# Patient Record
Sex: Female | Born: 1950 | Race: Black or African American | Hispanic: No | Marital: Married | Smoking: Never smoker
Health system: Southern US, Community
[De-identification: ages and names within clinical notes are randomized; demographics above are authoritative.]

## PROBLEM LIST (undated history)

## (undated) DIAGNOSIS — I10 Essential (primary) hypertension: Secondary | ICD-10-CM

## (undated) DIAGNOSIS — E079 Disorder of thyroid, unspecified: Secondary | ICD-10-CM

## (undated) HISTORY — DX: Disorder of thyroid, unspecified: E07.9

## (undated) HISTORY — DX: Essential (primary) hypertension: I10

## (undated) HISTORY — PX: ABDOMINAL HYSTERECTOMY: SHX81

## (undated) HISTORY — PX: EYE SURGERY: SHX253

## (undated) HISTORY — PX: BRAIN SURGERY: SHX531

## (undated) HISTORY — PX: APPENDECTOMY: SHX54

---

## 1998-07-03 ENCOUNTER — Emergency Department (HOSPITAL_COMMUNITY): Admission: EM | Admit: 1998-07-03 | Discharge: 1998-07-03 | Payer: Self-pay | Admitting: Emergency Medicine

## 1998-07-03 ENCOUNTER — Encounter: Payer: Self-pay | Admitting: Emergency Medicine

## 1999-01-13 ENCOUNTER — Encounter: Payer: Self-pay | Admitting: Emergency Medicine

## 1999-01-13 ENCOUNTER — Inpatient Hospital Stay (HOSPITAL_COMMUNITY): Admission: EM | Admit: 1999-01-13 | Discharge: 1999-01-15 | Payer: Self-pay | Admitting: Emergency Medicine

## 1999-01-15 ENCOUNTER — Encounter: Payer: Self-pay | Admitting: Emergency Medicine

## 1999-01-15 ENCOUNTER — Encounter: Payer: Self-pay | Admitting: *Deleted

## 1999-11-09 ENCOUNTER — Encounter: Payer: Self-pay | Admitting: Emergency Medicine

## 1999-11-09 ENCOUNTER — Emergency Department (HOSPITAL_COMMUNITY): Admission: EM | Admit: 1999-11-09 | Discharge: 1999-11-09 | Payer: Self-pay | Admitting: Emergency Medicine

## 2000-01-06 ENCOUNTER — Other Ambulatory Visit: Admission: RE | Admit: 2000-01-06 | Discharge: 2000-01-06 | Payer: Self-pay | Admitting: Obstetrics and Gynecology

## 2000-01-09 ENCOUNTER — Emergency Department (HOSPITAL_COMMUNITY): Admission: EM | Admit: 2000-01-09 | Discharge: 2000-01-09 | Payer: Self-pay | Admitting: Emergency Medicine

## 2000-02-28 ENCOUNTER — Ambulatory Visit (HOSPITAL_COMMUNITY): Admission: RE | Admit: 2000-02-28 | Discharge: 2000-02-28 | Payer: Self-pay | Admitting: Internal Medicine

## 2000-02-28 ENCOUNTER — Encounter: Payer: Self-pay | Admitting: Internal Medicine

## 2002-02-05 ENCOUNTER — Other Ambulatory Visit: Admission: RE | Admit: 2002-02-05 | Discharge: 2002-02-05 | Payer: Self-pay | Admitting: Obstetrics and Gynecology

## 2002-04-02 ENCOUNTER — Encounter (INDEPENDENT_AMBULATORY_CARE_PROVIDER_SITE_OTHER): Payer: Self-pay | Admitting: Specialist

## 2002-04-02 ENCOUNTER — Ambulatory Visit (HOSPITAL_COMMUNITY): Admission: RE | Admit: 2002-04-02 | Discharge: 2002-04-02 | Payer: Self-pay | Admitting: Gastroenterology

## 2003-08-10 ENCOUNTER — Emergency Department (HOSPITAL_COMMUNITY): Admission: AD | Admit: 2003-08-10 | Discharge: 2003-08-10 | Payer: Self-pay | Admitting: Family Medicine

## 2004-10-07 ENCOUNTER — Ambulatory Visit (HOSPITAL_COMMUNITY): Admission: RE | Admit: 2004-10-07 | Discharge: 2004-10-07 | Payer: Self-pay | Admitting: Internal Medicine

## 2005-06-25 ENCOUNTER — Emergency Department (HOSPITAL_COMMUNITY): Admission: EM | Admit: 2005-06-25 | Discharge: 2005-06-25 | Payer: Self-pay | Admitting: Emergency Medicine

## 2006-03-10 ENCOUNTER — Emergency Department (HOSPITAL_COMMUNITY): Admission: EM | Admit: 2006-03-10 | Discharge: 2006-03-10 | Payer: Self-pay | Admitting: *Deleted

## 2006-11-26 ENCOUNTER — Emergency Department (HOSPITAL_COMMUNITY): Admission: EM | Admit: 2006-11-26 | Discharge: 2006-11-26 | Payer: Self-pay | Admitting: Emergency Medicine

## 2007-09-30 ENCOUNTER — Encounter: Admission: RE | Admit: 2007-09-30 | Discharge: 2007-09-30 | Payer: Self-pay | Admitting: Orthopedic Surgery

## 2007-10-19 ENCOUNTER — Encounter: Admission: RE | Admit: 2007-10-19 | Discharge: 2007-10-19 | Payer: Self-pay | Admitting: Orthopedic Surgery

## 2007-11-09 ENCOUNTER — Encounter: Admission: RE | Admit: 2007-11-09 | Discharge: 2007-11-09 | Payer: Self-pay | Admitting: Orthopedic Surgery

## 2008-07-22 ENCOUNTER — Encounter: Admission: RE | Admit: 2008-07-22 | Discharge: 2008-07-22 | Payer: Self-pay | Admitting: Orthopedic Surgery

## 2010-06-13 ENCOUNTER — Encounter: Payer: Self-pay | Admitting: Chiropractic Medicine

## 2010-06-13 ENCOUNTER — Encounter: Payer: Self-pay | Admitting: Orthopedic Surgery

## 2011-03-08 LAB — COMPREHENSIVE METABOLIC PANEL
ALT: 14
BUN: 12
Calcium: 9.2
Chloride: 103
GFR calc Af Amer: >60
Sodium: 137
Total Protein: 7.2

## 2011-03-08 LAB — POCT CARDIAC MARKERS
CKMB, poc: 2.8
Operator id: 1211
Troponin i, poc: <0.05
Troponin i, poc: <0.05

## 2011-03-08 LAB — DIFFERENTIAL
Basophils Absolute: 0
Eosinophils Relative: 2

## 2011-03-08 LAB — D-DIMER, QUANTITATIVE: D-Dimer, Quant: 0.27

## 2011-03-08 LAB — CBC
Hemoglobin: 12.3
Platelets: 273
RDW: 14

## 2011-04-22 ENCOUNTER — Other Ambulatory Visit: Payer: Self-pay | Admitting: Orthopedic Surgery

## 2011-04-22 DIAGNOSIS — M542 Cervicalgia: Secondary | ICD-10-CM

## 2011-04-24 ENCOUNTER — Ambulatory Visit
Admission: RE | Admit: 2011-04-24 | Discharge: 2011-04-24 | Disposition: A | Discharge status: Home / Self Care | Payer: 59 | Source: Ambulatory Visit | Attending: Orthopedic Surgery | Admitting: Orthopedic Surgery

## 2011-04-24 DIAGNOSIS — M542 Cervicalgia: Secondary | ICD-10-CM

## 2013-04-11 ENCOUNTER — Encounter (INDEPENDENT_AMBULATORY_CARE_PROVIDER_SITE_OTHER): Payer: 59 | Admitting: Ophthalmology

## 2013-04-11 DIAGNOSIS — I10 Essential (primary) hypertension: Secondary | ICD-10-CM

## 2013-04-11 DIAGNOSIS — H35039 Hypertensive retinopathy, unspecified eye: Secondary | ICD-10-CM

## 2013-04-11 DIAGNOSIS — H5340 Unspecified visual field defects: Secondary | ICD-10-CM

## 2013-04-11 DIAGNOSIS — H43819 Vitreous degeneration, unspecified eye: Secondary | ICD-10-CM

## 2013-04-11 DIAGNOSIS — H31009 Unspecified chorioretinal scars, unspecified eye: Secondary | ICD-10-CM

## 2013-04-29 ENCOUNTER — Ambulatory Visit: Payer: Self-pay | Admitting: Ophthalmology

## 2013-05-03 ENCOUNTER — Ambulatory Visit: Payer: Self-pay | Admitting: Ophthalmology

## 2013-12-26 ENCOUNTER — Other Ambulatory Visit: Payer: Self-pay | Admitting: Internal Medicine

## 2013-12-26 DIAGNOSIS — R911 Solitary pulmonary nodule: Secondary | ICD-10-CM

## 2014-01-01 ENCOUNTER — Inpatient Hospital Stay: Admission: RE | Admit: 2014-01-01 | Payer: 59 | Source: Ambulatory Visit

## 2014-01-09 ENCOUNTER — Ambulatory Visit
Admission: RE | Admit: 2014-01-09 | Discharge: 2014-01-09 | Disposition: A | Discharge status: Home / Self Care | Payer: 59 | Source: Ambulatory Visit | Attending: Internal Medicine | Admitting: Internal Medicine

## 2014-01-09 DIAGNOSIS — R911 Solitary pulmonary nodule: Secondary | ICD-10-CM

## 2014-05-29 ENCOUNTER — Other Ambulatory Visit (HOSPITAL_COMMUNITY): Payer: Self-pay | Admitting: Respiratory Therapy

## 2014-05-29 DIAGNOSIS — J45909 Unspecified asthma, uncomplicated: Secondary | ICD-10-CM

## 2014-06-19 ENCOUNTER — Ambulatory Visit (HOSPITAL_COMMUNITY)
Admission: RE | Admit: 2014-06-19 | Discharge: 2014-06-19 | Disposition: A | Discharge status: Home / Self Care | Payer: 59 | Source: Ambulatory Visit | Attending: Internal Medicine | Admitting: Internal Medicine

## 2014-06-19 DIAGNOSIS — J45909 Unspecified asthma, uncomplicated: Secondary | ICD-10-CM | POA: Diagnosis not present

## 2014-06-19 LAB — PULMONARY FUNCTION TEST
DL/VA % pred: 120 %
DL/VA: 5.64 ml/min/mmHg/L
DLCO COR % PRED: 80 %
DLCO UNC % PRED: 80 %
DLCO cor: 18.5 ml/min/mmHg
DLCO unc: 18.5 ml/min/mmHg
FEF 25-75 Post: 2.55 L/sec
FEF 25-75 Pre: 2.57 L/sec
FEF2575-%Change-Post: 0 %
FEF2575-%PRED-POST: 141 %
FEF2575-%Pred-Pre: 142 %
FEV1-%CHANGE-POST: -1 %
FEV1-%Pred-Post: 109 %
FEV1-%Pred-Pre: 111 %
FEV1-Post: 2.05 L
FEV1-Pre: 2.09 L
FEV1FVC-%CHANGE-POST: 6 %
FEV1FVC-%PRED-PRE: 108 %
FEV6-%CHANGE-POST: -7 %
FEV6-%PRED-POST: 97 %
FEV6-%Pred-Pre: 106 %
FEV6-POST: 2.27 L
FEV6-Pre: 2.47 L
FEV6FVC-%CHANGE-POST: 0 %
FEV6FVC-%PRED-POST: 104 %
FEV6FVC-%Pred-Pre: 103 %
FVC-%Change-Post: -8 %
FVC-%PRED-PRE: 102 %
FVC-%Pred-Post: 94 %
FVC-POST: 2.27 L
FVC-Pre: 2.47 L
Post FEV1/FVC ratio: 90 %
Post FEV6/FVC ratio: 100 %
Pre FEV1/FVC ratio: 85 %
Pre FEV6/FVC Ratio: 100 %
RV % PRED: 65 %
RV: 1.33 L
TLC % PRED: 78 %
TLC: 3.83 L

## 2014-06-19 MED ORDER — ALBUTEROL SULFATE (2.5 MG/3ML) 0.083% IN NEBU
2.5000 mg | INHALATION_SOLUTION | Freq: Once | RESPIRATORY_TRACT | Status: AC
  Administered 2014-06-19: 2.5 mg via RESPIRATORY_TRACT

## 2014-11-19 ENCOUNTER — Encounter: Payer: Self-pay | Admitting: Podiatry

## 2014-11-19 ENCOUNTER — Ambulatory Visit (INDEPENDENT_AMBULATORY_CARE_PROVIDER_SITE_OTHER): Payer: 59 | Admitting: Podiatry

## 2014-11-19 VITALS — BP 112/68 | HR 57 | Ht 62.0 in | Wt 164.0 lb

## 2014-11-19 DIAGNOSIS — M79605 Pain in left leg: Secondary | ICD-10-CM

## 2014-11-19 DIAGNOSIS — M79672 Pain in left foot: Secondary | ICD-10-CM | POA: Diagnosis not present

## 2014-11-19 DIAGNOSIS — M79602 Pain in left arm: Secondary | ICD-10-CM

## 2014-11-19 DIAGNOSIS — M79604 Pain in right leg: Secondary | ICD-10-CM | POA: Insufficient documentation

## 2014-11-19 DIAGNOSIS — L600 Ingrowing nail: Secondary | ICD-10-CM | POA: Diagnosis not present

## 2014-11-19 NOTE — Patient Instructions (Signed)
Ingrown nail surgery done. Follow instruction. Return in one week.

## 2014-11-19 NOTE — Progress Notes (Signed)
Subjective: 64 year old female presents with painful ingrown left great toe. Stated that she has had previous ingrown nail surgery done over 20 years ago and is back and hurting again. She does a lot of Gym exercise and toes are painful in her shoes. Patient wants the nail to be taken care of.  Objective: Neurovascular status are within normal.  Left great toe medial border is thick and ingrown with tenderness. No drainage or cellulitis noted.  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  Plan:  Phenol and alcohol matrixectomy left great toe medial border. Left great toe was anesthetized with total 5ml mixture of 50/50 0.5% Marcaine plain and 1% Xylocaine plain. Affected medial nail border was reflected with a nail elevator and excised with nail nipper. Proximal nail matrix tissue was cauterized with Phenol soaked cotton applicator x 4 and neutralized with Alcohol soaked cotton applicator. The wound was dressed with Amerigel ointment dressing. Home care instructions and supply dispensed.  Return in 1 week for follow up.

## 2014-11-26 ENCOUNTER — Encounter: Payer: Self-pay | Admitting: Podiatry

## 2014-11-26 ENCOUNTER — Ambulatory Visit (INDEPENDENT_AMBULATORY_CARE_PROVIDER_SITE_OTHER): Payer: 59 | Admitting: Podiatry

## 2014-11-26 DIAGNOSIS — L600 Ingrowing nail: Secondary | ICD-10-CM

## 2014-11-26 NOTE — Patient Instructions (Signed)
Post nail surgery. No problem noted. Return as needed.

## 2014-11-26 NOTE — Progress Notes (Signed)
One week post op left big toe nail. No infection. Healed well. Return as needed.

## 2014-11-27 ENCOUNTER — Ambulatory Visit: Payer: 59 | Admitting: Podiatry

## 2015-04-22 ENCOUNTER — Ambulatory Visit (INDEPENDENT_AMBULATORY_CARE_PROVIDER_SITE_OTHER): Payer: Commercial Managed Care - HMO | Admitting: Family Medicine

## 2015-04-22 ENCOUNTER — Ambulatory Visit (INDEPENDENT_AMBULATORY_CARE_PROVIDER_SITE_OTHER): Payer: Commercial Managed Care - HMO

## 2015-04-22 VITALS — BP 112/66 | HR 67 | Temp 98.8°F | Resp 16 | Ht 62.0 in | Wt 167.0 lb

## 2015-04-22 DIAGNOSIS — R1030 Lower abdominal pain, unspecified: Secondary | ICD-10-CM

## 2015-04-22 LAB — POCT URINALYSIS DIP (MANUAL ENTRY)
Bilirubin, UA: NEGATIVE
Glucose, UA: NEGATIVE
Ketones, POC UA: NEGATIVE
Leukocytes, UA: NEGATIVE
Nitrite, UA: NEGATIVE
Protein Ur, POC: NEGATIVE
Spec Grav, UA: 1.025
Urobilinogen, UA: 0.2
pH, UA: 5.5

## 2015-04-22 LAB — POC MICROSCOPIC URINALYSIS (UMFC)

## 2015-04-22 LAB — POCT CBC
Granulocyte percent: 57.8 %G (ref 37–80)
HCT, POC: 38.8 % (ref 37.7–47.9)
Hemoglobin: 12.9 g/dL (ref 12.2–16.2)
Lymph, poc: 2.4 (ref 0.6–3.4)
MCH, POC: 28.8 pg (ref 27–31.2)
MCHC: 33.4 g/dL (ref 31.8–35.4)
MCV: 86.4 fL (ref 80–97)
MID (cbc): 0.3 (ref 0–0.9)
MPV: 6.9 fL (ref 0–99.8)
POC Granulocyte: 3.8 (ref 2–6.9)
POC LYMPH PERCENT: 36.9 %L (ref 10–50)
POC MID %: 5.3 %M (ref 0–12)
Platelet Count, POC: 249 10*3/uL (ref 142–424)
RBC: 4.49 M/uL (ref 4.04–5.48)
RDW, POC: 14.7 %
WBC: 6.5 10*3/uL (ref 4.6–10.2)

## 2015-04-22 MED ORDER — AMOXICILLIN-POT CLAVULANATE 875-125 MG PO TABS: 1.0000 | ORAL_TABLET | Freq: Two times a day (BID) | ORAL | Status: DC

## 2015-04-22 NOTE — Addendum Note (Signed)
Addended by: Elvina SidleLAUENSTEIN, Ichael Pullara on: 04/22/2015 12:50 PM   Modules accepted: Level of Service

## 2015-04-22 NOTE — Progress Notes (Signed)
Subjective:  This chart was scribed for  Elvina SidleKurt Lauenstein MD, by Veverly FellsHatice Demirci,scribe, at [facility].  This patient was seen in room Room/bed info not found and the patient's care was started at 12:07 PM.   Chief Complaint  Patient presents with   Abdominal Pain    Lower, inguinal area onset yesterday gradually getting worse     Patient ID: Sheryl Riley Scales, female    DOB: 1951-03-18, 64 y.o.   MRN: [id number]  HPI  HPI Comments: Sheryl Riley Dascenzo is a 64 y.o. female who presents to the [facility] complaining of gradually worsening lower abdominal pain onset yesterday.  Patient states the pain is worse when she is walking.  She has hot flashes and is unable to tell if she has been having sweats/chills.   [place] and states that she was sitting down for a long period of time during the drive there.  She is currently retired.   Patient had a large quantity of popcorn over Thanksgiving  Denies fever, difficulty with urination, vaginal discharge/bleeding.  Patient has had a hysterectomy and has had her appendix removed.   She has lost close to 40 pounds recently with diet and exercise.   Patient Active Problem List   Diagnosis Date Noted   Ingrown nail 11/19/2014   Painful legs and moving toes 11/19/2014   Past Medical History  Diagnosis Date   Hypertension    Thyroid disease    Past Surgical History  Procedure Laterality Date   Appendectomy     Brain surgery     Abdominal hysterectomy     Eye surgery     No Known Allergies Prior to Admission medications   Medication Sig Start Date End Date Taking? Authorizing Provider  atenolol (TENORMIN) 100 MG tablet Take by mouth.   Yes Historical Provider, MD  levothyroxine (SYNTHROID, LEVOTHROID) 112 MCG tablet Take by mouth. 05/13/13  Yes Historical Provider, MD  Multiple Vitamin (MULTIVITAMIN) tablet Take 1 tablet by mouth daily.   Yes Historical Provider,  MD  triamterene-hydrochlorothiazide (MAXZIDE-25) 37.5-25 MG per tablet Take by mouth.   Yes Historical Provider, MD  PARoxetine (PAXIL) 10 MG tablet Take by mouth. 06/01/13   Historical Provider, MD   Social History   Social History   Marital Status: Married    Spouse Name: N/A   Number of Children: N/A   Years of Education: N/A   Occupational History   Not on file.   Social History Main Topics   Smoking status: Never Smoker    Smokeless tobacco: Never Used   Alcohol Use: No   Drug Use: No   Sexual Activity: Not on file   Other Topics Concern   Not on file   Social History Narrative       Review of Systems  Constitutional: Negative for fever and chills.  Eyes: Negative for pain, redness and itching.  Respiratory: Negative for cough, choking and shortness of breath.   Gastrointestinal: Positive for abdominal pain. Negative for nausea and vomiting.  Genitourinary: Negative for vaginal bleeding, vaginal discharge and vaginal pain.  Musculoskeletal: Negative for neck pain and neck stiffness.       Objective:   Physical Exam  Constitutional: She is oriented to person, place, and time. She appears well-developed and well-nourished. No distress.  HENT:  Head: Normocephalic and atraumatic.  Eyes: Pupils are equal, round, and reactive to light.  Neck: Normal range of motion.  Neck supple. No thyromegaly present.  Pulmonary/Chest: Effort normal. No respiratory distress.  Abdominal: There is tenderness. There is rebound and guarding.  She has guarding and rebound in the lower quadrant.   Lymphadenopathy:    She has no cervical adenopathy.  Neurological: She is alert and oriented to person, place, and time.  Skin: Skin is warm and dry.  Psychiatric: She has a normal mood and affect. Her behavior is normal.    Filed Vitals:   04/22/15 1155  BP: 112/66  Pulse: 67  Temp: 98.8 F (37.1 C)  TempSrc: Oral  Resp: 16  Height:  (1.575 m)  Weight: 167 lb  (75.751 kg)  SpO2: 98%   [facility] (PRIMARY) x-ray report read by Dr. Milus Glazier:  Abdomen: she has a large stool burden, primarily left descending colon and rectum.   Results for orders placed or performed in visit on 04/22/15  POCT CBC  Result Value Ref Range   WBC 6.5 4.6 - 10.2 K/uL   Lymph, poc 2.4 0.6 - 3.4   POC LYMPH PERCENT 36.9 10 - 50 %L   MID (cbc) 0.3 0 - 0.9   POC MID % 5.3 0 - 12 %M   POC Granulocyte 3.8 2 - 6.9   Granulocyte percent 57.8 37 - 80 %G   RBC 4.49 4.04 - 5.48 M/uL   Hemoglobin 12.9 12.2 - 16.2 g/dL   HCT, POC 16.1 09.6 - 47.9 %   MCV 86.4 80 - 97 fL   MCH, POC 28.8 27 - 31.2 pg   MCHC 33.4 31.8 - 35.4 g/dL   RDW, POC 04.5 %   Platelet Count, POC 249 142 - 424 K/uL   MPV 6.9 0 - 99.8 fL  POCT urinalysis dipstick  Result Value Ref Range   Color, UA yellow yellow   Clarity, UA clear clear   Glucose, UA negative negative   Bilirubin, UA negative negative   Ketones, POC UA negative negative   Spec Grav, UA 1.025    Blood, UA trace-lysed (A) negative   pH, UA 5.5    Protein Ur, POC negative negative   Urobilinogen, UA 0.2    Nitrite, UA Negative Negative   Leukocytes, UA Negative Negative  POCT Microscopic Urinalysis ([facility])  Result Value Ref Range   WBC,UR,HPF,POC None None WBC/hpf   RBC,UR,HPF,POC Few (A) None RBC/hpf   Bacteria None None, Too numerous to count   Mucus Present (A) Absent   Epithelial Cells, UR Per Microscopy Few (A) None, Too numerous to count cells/hpf        Assessment & Plan:   This chart was scribed in my presence and reviewed by me personally.    ICD-9-CM ICD-10-CM   1. Abdominal pain, lower 789.09 R10.30 POCT CBC     POCT urinalysis dipstick     POCT Microscopic Urinalysis ([facility])     DG Abd 1 View     amoxicillin-clavulanate (AUGMENTIN) 875-125 MG tablet     Signed, Elvina Sidle, MD

## 2015-04-26 ENCOUNTER — Emergency Department (HOSPITAL_COMMUNITY): Payer: Commercial Managed Care - HMO

## 2015-04-26 ENCOUNTER — Encounter (HOSPITAL_COMMUNITY): Payer: Self-pay | Admitting: *Deleted

## 2015-04-26 ENCOUNTER — Ambulatory Visit (INDEPENDENT_AMBULATORY_CARE_PROVIDER_SITE_OTHER): Payer: Commercial Managed Care - HMO | Admitting: Family Medicine

## 2015-04-26 ENCOUNTER — Emergency Department (HOSPITAL_COMMUNITY)
Admission: EM | Admit: 2015-04-26 | Discharge: 2015-04-26 | Disposition: A | Discharge status: Home / Self Care | Payer: Commercial Managed Care - HMO | Attending: Emergency Medicine | Admitting: Emergency Medicine

## 2015-04-26 ENCOUNTER — Telehealth: Payer: Self-pay

## 2015-04-26 VITALS — BP 122/78 | HR 64 | Temp 97.7°F | Resp 17 | Wt 167.3 lb

## 2015-04-26 DIAGNOSIS — Z79899 Other long term (current) drug therapy: Secondary | ICD-10-CM | POA: Diagnosis not present

## 2015-04-26 DIAGNOSIS — I10 Essential (primary) hypertension: Secondary | ICD-10-CM | POA: Insufficient documentation

## 2015-04-26 DIAGNOSIS — M899 Disorder of bone, unspecified: Secondary | ICD-10-CM | POA: Diagnosis not present

## 2015-04-26 DIAGNOSIS — E079 Disorder of thyroid, unspecified: Secondary | ICD-10-CM | POA: Insufficient documentation

## 2015-04-26 DIAGNOSIS — Z792 Long term (current) use of antibiotics: Secondary | ICD-10-CM | POA: Diagnosis not present

## 2015-04-26 DIAGNOSIS — M25559 Pain in unspecified hip: Secondary | ICD-10-CM

## 2015-04-26 DIAGNOSIS — R1031 Right lower quadrant pain: Secondary | ICD-10-CM | POA: Diagnosis not present

## 2015-04-26 DIAGNOSIS — M79604 Pain in right leg: Secondary | ICD-10-CM | POA: Insufficient documentation

## 2015-04-26 LAB — URINALYSIS, ROUTINE W REFLEX MICROSCOPIC
Bilirubin Urine: NEGATIVE
Glucose, UA: NEGATIVE mg/dL
KETONES UR: NEGATIVE mg/dL
LEUKOCYTES UA: NEGATIVE
NITRITE: NEGATIVE
PH: 6 (ref 5.0–8.0)
PROTEIN: NEGATIVE mg/dL
Specific Gravity, Urine: 1.02 (ref 1.005–1.030)

## 2015-04-26 LAB — COMPREHENSIVE METABOLIC PANEL
ALT: 14 U/L (ref 14–54)
AST: 18 U/L (ref 15–41)
Albumin: 3.7 g/dL (ref 3.5–5.0)
Alkaline Phosphatase: 68 U/L (ref 38–126)
Anion gap: 7 (ref 5–15)
BILIRUBIN TOTAL: 0.6 mg/dL (ref 0.3–1.2)
BUN: 17 mg/dL (ref 6–20)
CO2: 31 mmol/L (ref 22–32)
Calcium: 10 mg/dL (ref 8.9–10.3)
Chloride: 101 mmol/L (ref 101–111)
Creatinine, Ser: 0.79 mg/dL (ref 0.44–1.00)
GFR calc Af Amer: >60 mL/min (ref >60)
Glucose, Bld: 92 mg/dL (ref 65–99)
POTASSIUM: 3.4 mmol/L — AB (ref 3.5–5.1)
Sodium: 139 mmol/L (ref 135–145)
TOTAL PROTEIN: 8.2 g/dL — AB (ref 6.5–8.1)

## 2015-04-26 LAB — CBC
HEMATOCRIT: 41.1 % (ref 36.0–46.0)
Hemoglobin: 14.1 g/dL (ref 12.0–15.0)
MCH: 30.5 pg (ref 26.0–34.0)
MCHC: 34.3 g/dL (ref 30.0–36.0)
MCV: 89 fL (ref 78.0–100.0)
PLATELETS: 283 10*3/uL (ref 150–400)
RBC: 4.62 MIL/uL (ref 3.87–5.11)
RDW: 13.8 % (ref 11.5–15.5)
WBC: 6.6 10*3/uL (ref 4.0–10.5)

## 2015-04-26 LAB — URINE MICROSCOPIC-ADD ON
Bacteria, UA: NONE SEEN
WBC UA: NONE SEEN WBC/hpf (ref 0–5)

## 2015-04-26 LAB — LIPASE, BLOOD: Lipase: 37 U/L (ref 11–51)

## 2015-04-26 MED ORDER — SODIUM CHLORIDE 0.9 % IV BOLUS (SEPSIS)
1000.0000 mL | Freq: Once | INTRAVENOUS | Status: AC
  Administered 2015-04-26: 1000 mL via INTRAVENOUS

## 2015-04-26 MED ORDER — FENTANYL CITRATE (PF) 100 MCG/2ML IJ SOLN
100.0000 ug | Freq: Once | INTRAMUSCULAR | Status: AC
  Administered 2015-04-26: 100 ug via INTRAVENOUS
  Filled 2015-04-26: qty 2

## 2015-04-26 MED ORDER — DIAZEPAM 5 MG/ML IJ SOLN
2.5000 mg | Freq: Once | INTRAMUSCULAR | Status: AC
  Administered 2015-04-26: 2.5 mg via INTRAVENOUS
  Filled 2015-04-26: qty 2

## 2015-04-26 MED ORDER — BARIUM SULFATE 2.1 % PO SUSP
ORAL | Status: AC
  Administered 2015-04-26: 450 mL via ORAL
  Filled 2015-04-26: qty 1

## 2015-04-26 MED ORDER — ONDANSETRON HCL 4 MG/2ML IJ SOLN
4.0000 mg | Freq: Once | INTRAMUSCULAR | Status: AC
  Administered 2015-04-26: 4 mg via INTRAVENOUS
  Filled 2015-04-26: qty 2

## 2015-04-26 MED ORDER — DIAZEPAM 5 MG PO TABS: 5.0000 mg | ORAL_TABLET | Freq: Three times a day (TID) | ORAL | Status: DC | PRN

## 2015-04-26 MED ORDER — IOHEXOL 300 MG/ML  SOLN
100.0000 mL | Freq: Once | INTRAMUSCULAR | Status: AC | PRN
  Administered 2015-04-26: 100 mL via INTRAVENOUS

## 2015-04-26 MED ORDER — IBUPROFEN 400 MG PO TABS: 400.0000 mg | ORAL_TABLET | Freq: Three times a day (TID) | ORAL | Status: AC

## 2015-04-26 NOTE — ED Provider Notes (Signed)
CSN: [id number]     Arrival date & time 04/26/15  1516 History   First MD Initiated Contact with Patient 04/26/15 [phone]     Chief Complaint  Patient presents with  . Pelvic Pain     (Consider location/radiation/quality/duration/timing/severity/associated sxs/prior Treatment) HPI  64 year old female with a history of hypertension and thyroid disease presents with approximately 5 days of Progressively worsening suprapubic pain. No nausea, vomiting, diarrhea, constipation, fever or other associated symptoms. Describes it as feeling like a broken bone. Made worse by moving her right leg, but not her left leg. Has progressively worsen during that time. She is having difficulty walking because of pain. She had a long car ride to 4 days and back the day before started. No asymmetric swelling in her lower extremities.   Past Medical History  Diagnosis Date  . Hypertension   . Thyroid disease    Past Surgical History  Procedure Laterality Date  . Appendectomy    . Brain surgery    . Abdominal hysterectomy    . Eye surgery     Family History  Problem Relation Age of Onset  . Alcohol abuse Brother   . Hypertension Brother   . Glaucoma Brother   . Hypertension Brother   . Glaucoma Brother   . Hypertension Brother    Social History  Substance Use Topics  . Smoking status: Never Smoker   . Smokeless tobacco: Never Used  . Alcohol Use: No   OB History    No data available     Review of Systems  Constitutional: Negative for fever and chills.  HENT: Negative for sneezing.   Eyes: Negative for photophobia and pain.  Respiratory: Negative for cough and shortness of breath.   Cardiovascular: Negative for chest pain.  Gastrointestinal: Positive for abdominal pain. Negative for nausea, vomiting, diarrhea and constipation.  Endocrine: Negative for polydipsia and polyuria.  Genitourinary: Negative for urgency and dyspareunia.  Musculoskeletal: Negative for back pain.  Skin: Negative for  rash and wound.  Neurological: Negative for seizures and numbness.  All other systems reviewed and are negative.     Allergies  Review of patient's allergies indicates no known allergies.  Home Medications   Prior to Admission medications   Medication Sig Start Date End Date Taking? Authorizing Provider  acetaminophen (TYLENOL) 325 MG tablet Take 650 mg by mouth every 6 (six) hours as needed for mild pain.   Yes Historical Provider, MD  atenolol (TENORMIN) 50 MG tablet Take 50 mg by mouth daily.   Yes Historical Provider, MD  Cyanocobalamin (VITAMIN B-12 PO) Take 1 tablet by mouth daily.   Yes Historical Provider, MD  levothyroxine (SYNTHROID, LEVOTHROID) 100 MCG tablet Take 100 mcg by mouth daily before breakfast.   Yes Historical Provider, MD  Multiple Vitamin (MULTIVITAMIN) tablet Take 1 tablet by mouth daily.   Yes Historical Provider, MD  niacin 500 MG tablet Take 500 mg by mouth at bedtime.   Yes Historical Provider, MD  omega-3 acid ethyl esters (LOVAZA) 1 G capsule Take 1 g by mouth 2 (two) times daily.    Yes Historical Provider, MD  triamterene-hydrochlorothiazide (MAXZIDE-25) 37.5-25 MG per tablet Take 1 tablet by mouth daily.    Yes Historical Provider, MD  amoxicillin-clavulanate (AUGMENTIN) 875-125 MG tablet Take 1 tablet by mouth 2 (two) times daily. Patient not taking: Reported on 04/26/2015 04/22/15   Elvina SidleKurt Lauenstein, MD  diazepam (VALIUM) 5 MG tablet Take 1 tablet (5 mg total) by mouth every 8 (eight) hours  as needed for muscle spasms. 04/26/15   Marily Memos, MD  ibuprofen (ADVIL,MOTRIN) 400 MG tablet Take 1 tablet (400 mg total) by mouth 3 (three) times daily. 04/26/15 05/03/15  Barbara Cower Chao Blazejewski, MD   BP 127/65 mmHg  Pulse 61  Temp(Src) 97.7 F (36.5 C) (Oral)  Resp 11  SpO2 99% Physical Exam  Constitutional: She is oriented to person, place, and time. She appears well-developed and well-nourished.  HENT:  Head: Normocephalic and atraumatic.  Neck: Normal range of  motion.  Cardiovascular: Normal rate and regular rhythm.   Pulmonary/Chest: Effort normal and breath sounds normal. No stridor. No respiratory distress.  Abdominal: Soft. She exhibits no distension. There is tenderness (suprapubic and less in RLQ). There is no rebound and no guarding.  Musculoskeletal: Normal range of motion. She exhibits no edema or tenderness.  Suprapubic abdominal pain worse with raising right leg  Neurological: She is alert and oriented to person, place, and time.  Skin: Skin is warm and dry.  Nursing note and vitals reviewed.   ED Course  Procedures (including critical care time) Labs Review Labs Reviewed  COMPREHENSIVE METABOLIC PANEL - Abnormal; Notable for the following:    Potassium 3.4 (*)    Total Protein 8.2 (*)    All other components within normal limits  URINALYSIS, ROUTINE W REFLEX MICROSCOPIC (NOT AT [facility]) - Abnormal; Notable for the following:    Hgb urine dipstick SMALL (*)    All other components within normal limits  URINE MICROSCOPIC-ADD ON - Abnormal; Notable for the following:    Squamous Epithelial / LPF 0-5 (*)    All other components within normal limits  LIPASE, BLOOD  CBC    Imaging Review Ct Abdomen Pelvis W Contrast  04/26/2015  CLINICAL DATA:  Lower abdomen and pelvic pain. Clinical concern for diverticulitis or abscess. EXAM: CT ABDOMEN AND PELVIS WITH CONTRAST TECHNIQUE: Multidetector CT imaging of the abdomen and pelvis was performed using the standard protocol following bolus administration of intravenous contrast. CONTRAST:  OMNIPAQUE IOHEXOL 300 MG/ML  SOLN COMPARISON:  Chest CT dated 01/09/2014. FINDINGS: Lower chest:  Clear lung bases. Hepatobiliary: Normal appearing liver and gallbladder. Pancreas: Mildly dilated pancreatic duct, measuring 3.6 mm in maximum diameter. No obstructing stone or mass seen. Spleen: Within normal limits in size and appearance. Adrenals/Urinary Tract: No masses identified. No evidence of  hydronephrosis. Stomach/Bowel: Normal appearing colon, small bowel and stomach. No evidence of appendicitis or diverticulitis. Vascular/Lymphatic: Minimal atheromatous arterial calcifications. No enlarged lymph nodes. Reproductive: No mass or other significant abnormality. Other: Small amount of free peritoneal fluid in the inferior pelvis on the right. Musculoskeletal: Lower thoracic spine degenerative changes. Disc bulges in the lower lumbar spine. IMPRESSION: 1. Small amount of free peritoneal fluid in the inferior pelvis on the right. 2. Mildly dilated pancreatic duct with no visible obstructing stone or mass. This is of doubtful clinical significance. 3. No evidence of diverticulitis and no abscess. Electronically Signed   By: Beckie Salts M.D.   On: 04/26/2015 19:35   I have personally reviewed and evaluated these images and lab results as part of my medical decision-making.   EKG Interpretation None      MDM   Final diagnoses:  Pain of right lower extremity   64 yo w/ concerning for diverticulitis, UTI, psoas abscess, dvt, vs msk. Will check labs, tx symptomatically and CT.  Symptoms improved markedly with valium. Able to ambulate better with decreased pain. CT negative for acute findings. i think this is  MSK in nature. Will tx appropriately. When i brought this up she stated she had started using the 'thigh machine' at the gym more recently doing as much as 200 reps continuously. I asked her to stop, at least until her symptoms improved.    Marily Memos, MD 04/26/15 2300

## 2015-04-26 NOTE — ED Notes (Signed)
Pt sent here from [facility] due to lower abd/pelvic pain. Reports being treated with antibiotics for possible diverticulitis. Denies n/v/d or urinary symptoms.

## 2015-04-26 NOTE — ED Notes (Signed)
Patient transported to CT 

## 2015-04-26 NOTE — Discharge Instructions (Signed)

## 2015-04-26 NOTE — Telephone Encounter (Signed)
Patient would like Dr. Elbert EwingsL to know she is not any better.  Wants a call back  [phone]

## 2015-04-26 NOTE — Progress Notes (Signed)
 @  By signing my name below, I, Raven Small, attest that this documentation has been prepared under the direction and in the presence of Elvina Sidle, MD.  Electronically Signed: Andrew Au, ED Scribe. 04/26/2015. 2:57 PM.   Patient ID: TAMRE CASS MRN: [id number], DOB: [birth year], 64 y.o. Date of Encounter: 04/26/2015, 2:57 PM  Primary Physician: Minda Meo, MD  Chief Complaint:  Chief Complaint  Patient presents with  . Follow-up    abdominal pain, lower side    HPI: 64 y.o. year old female with history below presents with low abdominal pain. She was seen here 4 days ago for the same complaint which was suspected to be diverticulitis and was treated with Augmentin. She returns today for persistent pain to pubis synthesis. She continues to deny falls or injury but does mentions she exercises regularly. She reports trouble ambulating secondary to pain and has started walking with a cane. She has difficulty moving her right leg and reports exacerbation to abdomina; pain with trying to do this. She also had worsening pain when lying on her left side and has trouble sleeping during the night because of this. She reports regular BM's.    Past Medical History  Diagnosis Date  . Hypertension   . Thyroid disease      Home Meds: Prior to Admission medications   Medication Sig Start Date End Date Taking? Authorizing Provider  amoxicillin-clavulanate (AUGMENTIN) 875-125 MG tablet Take 1 tablet by mouth 2 (two) times daily. 04/22/15  Yes Elvina Sidle, MD  atenolol (TENORMIN) 100 MG tablet Take by mouth.   Yes Historical Provider, MD  levothyroxine (SYNTHROID, LEVOTHROID) 112 MCG tablet Take by mouth. 05/13/13  Yes Historical Provider, MD  Multiple Vitamin (MULTIVITAMIN) tablet Take 1 tablet by mouth daily.   Yes Historical Provider, MD  omega-3 acid ethyl esters (LOVAZA) 1 G capsule Take by mouth 2 (two) times daily.   Yes Historical Provider, MD  PARoxetine (PAXIL)  10 MG tablet Take by mouth. 06/01/13  Yes Historical Provider, MD  triamterene-hydrochlorothiazide (MAXZIDE-25) 37.5-25 MG per tablet Take by mouth.   Yes Historical Provider, MD    Allergies: No Known Allergies  Social History   Social History  . Marital Status: Married    Spouse Name: N/A  . Number of Children: N/A  . Years of Education: N/A   Occupational History  . Not on file.   Social History Main Topics  . Smoking status: Never Smoker   . Smokeless tobacco: Never Used  . Alcohol Use: No  . Drug Use: No  . Sexual Activity: Not on file   Other Topics Concern  . Not on file   Social History Narrative    Review of Systems: Constitutional: negative for chills, fever, night sweats, weight changes, or fatigue  HEENT: negative for vision changes, hearing loss, congestion, rhinorrhea, ST, epistaxis, or sinus pressure Cardiovascular: negative for chest pain or palpitations Respiratory: negative for hemoptysis, wheezing, shortness of breath, or cough Abdominal: negative for nausea, vomiting, diarrhea, or constipation Dermatological: negative for rash Neurologic: negative for headache, dizziness, or syncope All other systems reviewed and are otherwise negative with the exception to those above and in the HPI.   Physical Exam: Blood pressure 122/78, pulse 64, temperature 97.7 F (36.5 C), temperature source Oral, resp. rate 17, weight 167 lb 4.8 oz (75.887 kg), SpO2 99 %., Body mass index is 30.59 kg/(m^2). General: Well developed, well nourished, in no acute distress. Head: Normocephalic, atraumatic, eyes without discharge, sclera non-icteric, nares  are without discharge. Bilateral auditory canals clear, TM's are without perforation, pearly grey and translucent with reflective cone of light bilaterally. Oral cavity moist, posterior pharynx without exudate, erythema, peritonsillar abscess, or post nasal drip.  Neck: Supple. No thyromegaly. Full ROM. No lymphadenopathy. Lungs:  Clear bilaterally to auscultation without wheezes, rales, or rhonchi. Breathing is unlabored. Heart: RRR with S1 S2. No murmurs, rubs, or gallops appreciated. Abdomen: Soft, non-tender, non-distended with normoactive bowel sounds. No hepatomegaly. No rebound/guarding. No obvious abdominal masses. Msk:  Strength and tone normal for age. Extremities/Skin: Warm and dry. No clubbing or cyanosis. No edema. No rashes or suspicious lesions. Neuro: Alert and oriented X 3. Moves all extremities spontaneously. Gait is normal. CNII-XII grossly in tact. Psych:  Responds to questions appropriately with a normal affect.    ASSESSMENT AND PLAN:  64 y.o. year old female with worsening pubic ramus pain This chart was scribed in my presence and reviewed by me personally.    ICD-9-CM ICD-10-CM   1. Pubic bone pain 733.90 M89.9    I originally thought this patient had diverticulitis. At this point her pain is exquisitely located over the symphysis pubis and I believe she needs further imaging to determine what is causing this. In view of the fact that she's had cancer in the past, either CAT scan or MRI would be preferred given the normal plain film she had several days ago.  I direct the patient go to the emergency room and she accepts my advice and will go directly to [facility]   Signed, Elvina SidleKurt Christain Niznik, MD 04/26/2015 2:57 PM

## 2015-04-27 NOTE — Telephone Encounter (Signed)
Pt went to ED

## 2015-11-25 ENCOUNTER — Other Ambulatory Visit: Payer: Self-pay | Admitting: Orthopaedic Surgery

## 2015-11-25 DIAGNOSIS — M25512 Pain in left shoulder: Secondary | ICD-10-CM

## 2015-12-01 ENCOUNTER — Ambulatory Visit
Admission: RE | Admit: 2015-12-01 | Discharge: 2015-12-01 | Disposition: A | Discharge status: Home / Self Care | Payer: Commercial Managed Care - HMO | Source: Ambulatory Visit | Attending: Orthopaedic Surgery | Admitting: Orthopaedic Surgery

## 2015-12-01 DIAGNOSIS — M25512 Pain in left shoulder: Secondary | ICD-10-CM

## 2015-12-02 ENCOUNTER — Other Ambulatory Visit: Payer: 59

## 2016-03-22 ENCOUNTER — Encounter (INDEPENDENT_AMBULATORY_CARE_PROVIDER_SITE_OTHER): Payer: Self-pay | Admitting: Orthopaedic Surgery

## 2016-03-22 ENCOUNTER — Ambulatory Visit (INDEPENDENT_AMBULATORY_CARE_PROVIDER_SITE_OTHER): Payer: Commercial Managed Care - HMO | Admitting: Orthopaedic Surgery

## 2016-03-22 DIAGNOSIS — M7542 Impingement syndrome of left shoulder: Secondary | ICD-10-CM | POA: Insufficient documentation

## 2016-03-22 NOTE — Progress Notes (Signed)
   Office Visit Note   Patient: Sheryl Riley           Date of Birth: 01/06/1951           MRN: [id number] Visit Date: 03/22/2016              Requested by: Geoffry Paradiseichard Aronson, MD [address] [place], [place] [id number] PCP: Minda MeoARONSON,RICHARD A, MD   Assessment & Plan: Visit Diagnoses:  1. Impingement syndrome of left shoulder     Plan:  - patient is improving well - continue HEP - essentially at MMI - f/u prn  Follow-Up Instructions: Return if symptoms worsen or fail to improve.   Orders:  No orders of the defined types were placed in this encounter.  No orders of the defined types were placed in this encounter.     Procedures: No procedures performed   Clinical Data: No additional findings.   Subjective: Chief Complaint  Patient presents with  . Left Shoulder - Routine Post Op, Follow-up    3 month f/u for left shoulder scope, DCE, SAD.  Doing well.  Not taking pain meds.  Only has mild pain when reaching way up above head.  Satisfied with surgery.    Review of Systems   Objective: Vital Signs: There were no vitals taken for this visit.  Physical Exam  Left Shoulder Exam   Tenderness  The patient is experiencing no tenderness.     Muscle Strength  The patient has normal left shoulder strength.  Other  Sensation: normal Pulse: present       Specialty Comments:  No specialty comments available.  Imaging: No results found.   PMFS History: Patient Active Problem List   Diagnosis Date Noted  . Impingement syndrome of left shoulder 03/22/2016  . Ingrown nail 11/19/2014  . Painful legs and moving toes 11/19/2014   Past Medical History:  Diagnosis Date  . Hypertension   . Thyroid disease     Family History  Problem Relation Age of Onset  . Alcohol abuse Brother   . Hypertension Brother   . Glaucoma Brother   . Hypertension Brother   . Glaucoma Brother   . Hypertension Brother     Past Surgical History:  Procedure  Laterality Date  . ABDOMINAL HYSTERECTOMY    . APPENDECTOMY    . BRAIN SURGERY    . EYE SURGERY     Social History   Occupational History  . Not on file.   Social History Main Topics  . Smoking status: Never Smoker  . Smokeless tobacco: Never Used  . Alcohol use No  . Drug use: No  . Sexual activity: Not on file

## 2016-04-08 ENCOUNTER — Other Ambulatory Visit: Payer: Self-pay | Admitting: Internal Medicine

## 2016-04-08 ENCOUNTER — Ambulatory Visit
Admission: RE | Admit: 2016-04-08 | Discharge: 2016-04-08 | Disposition: A | Discharge status: Home / Self Care | Payer: Commercial Managed Care - HMO | Source: Ambulatory Visit | Attending: Internal Medicine | Admitting: Internal Medicine

## 2016-04-08 DIAGNOSIS — J45909 Unspecified asthma, uncomplicated: Secondary | ICD-10-CM

## 2016-04-13 ENCOUNTER — Other Ambulatory Visit: Payer: Self-pay | Admitting: Internal Medicine

## 2016-04-13 DIAGNOSIS — R918 Other nonspecific abnormal finding of lung field: Secondary | ICD-10-CM

## 2016-04-20 ENCOUNTER — Ambulatory Visit
Admission: RE | Admit: 2016-04-20 | Discharge: 2016-04-20 | Disposition: A | Discharge status: Home / Self Care | Payer: Commercial Managed Care - HMO | Source: Ambulatory Visit | Attending: Internal Medicine | Admitting: Internal Medicine

## 2016-04-20 DIAGNOSIS — R918 Other nonspecific abnormal finding of lung field: Secondary | ICD-10-CM

## 2016-12-30 ENCOUNTER — Emergency Department (HOSPITAL_COMMUNITY): Payer: Commercial Managed Care - HMO

## 2016-12-30 ENCOUNTER — Encounter (HOSPITAL_COMMUNITY): Payer: Self-pay | Admitting: Emergency Medicine

## 2016-12-30 DIAGNOSIS — K219 Gastro-esophageal reflux disease without esophagitis: Secondary | ICD-10-CM | POA: Insufficient documentation

## 2016-12-30 DIAGNOSIS — Z79899 Other long term (current) drug therapy: Secondary | ICD-10-CM | POA: Insufficient documentation

## 2016-12-30 DIAGNOSIS — R071 Chest pain on breathing: Secondary | ICD-10-CM | POA: Diagnosis present

## 2016-12-30 DIAGNOSIS — I10 Essential (primary) hypertension: Secondary | ICD-10-CM | POA: Diagnosis not present

## 2016-12-30 LAB — CBC
HEMATOCRIT: 37.7 % (ref 36.0–46.0)
HEMOGLOBIN: 12.7 g/dL (ref 12.0–15.0)
MCH: 29.1 pg (ref 26.0–34.0)
MCHC: 33.7 g/dL (ref 30.0–36.0)
MCV: 86.5 fL (ref 78.0–100.0)
Platelets: 275 10*3/uL (ref 150–400)
RBC: 4.36 MIL/uL (ref 3.87–5.11)
RDW: 14.1 % (ref 11.5–15.5)
WBC: 6.4 10*3/uL (ref 4.0–10.5)

## 2016-12-30 LAB — BASIC METABOLIC PANEL
ANION GAP: 8 (ref 5–15)
BUN: 18 mg/dL (ref 6–20)
CALCIUM: 9.4 mg/dL (ref 8.9–10.3)
CO2: 28 mmol/L (ref 22–32)
Chloride: 103 mmol/L (ref 101–111)
Creatinine, Ser: 0.96 mg/dL (ref 0.44–1.00)
GFR calc non Af Amer: 60 mL/min — ABNORMAL LOW (ref >60)
GLUCOSE: 108 mg/dL — AB (ref 65–99)
Potassium: 3.5 mmol/L (ref 3.5–5.1)
Sodium: 139 mmol/L (ref 135–145)

## 2016-12-30 LAB — I-STAT TROPONIN, ED: Troponin i, poc: 0 ng/mL (ref 0.00–0.08)

## 2016-12-30 NOTE — ED Triage Notes (Signed)
Patient reports central chest tightness with sob and dry cough onset this evening , denies emesis or diaphoresis , pain increases with deep inspiration .

## 2016-12-31 ENCOUNTER — Emergency Department (HOSPITAL_COMMUNITY)
Admission: EM | Admit: 2016-12-31 | Discharge: 2016-12-31 | Disposition: A | Discharge status: Home / Self Care | Payer: Commercial Managed Care - HMO | Attending: Emergency Medicine | Admitting: Emergency Medicine

## 2016-12-31 ENCOUNTER — Encounter (HOSPITAL_COMMUNITY): Payer: Self-pay | Admitting: Emergency Medicine

## 2016-12-31 ENCOUNTER — Other Ambulatory Visit: Payer: Self-pay

## 2016-12-31 DIAGNOSIS — K219 Gastro-esophageal reflux disease without esophagitis: Secondary | ICD-10-CM

## 2016-12-31 LAB — I-STAT TROPONIN, ED: Troponin i, poc: 0.01 ng/mL (ref 0.00–0.08)

## 2016-12-31 MED ORDER — GI COCKTAIL ~~LOC~~
30.0000 mL | Freq: Once | ORAL | Status: AC
  Administered 2016-12-31: 30 mL via ORAL
  Filled 2016-12-31: qty 30

## 2016-12-31 MED ORDER — SUCRALFATE 1 GM/10ML PO SUSP: 1.0000 g | Freq: Three times a day (TID) | ORAL | 0 refills | Status: AC

## 2016-12-31 NOTE — ED Provider Notes (Signed)
[facility]-EMERGENCY DEPT Provider Note   CSN: [id number] Arrival date & time: 12/30/16  2232   By signing my name below, I, Clarisse GougeXavier Herndon, attest that this documentation has been prepared under the direction and in the presence of Aleese Kamps, MD. Electronically signed, Clarisse GougeXavier Herndon, ED Scribe. 12/31/16. 2:57 AM.   History   Chief Complaint Chief Complaint  Patient presents with  . Chest Pain   The history is provided by the patient and medical records. No language interpreter was used.  Chest Pain   This is a new problem. The current episode started yesterday. The problem occurs constantly. The problem has been gradually worsening. The pain is associated with breathing and eating. The pain is present in the substernal region. The pain is moderate. The quality of the pain is described as burning and pressure-like. The pain does not radiate. The symptoms are aggravated by deep breathing. Pertinent negatives include no abdominal pain, no back pain, no exertional chest pressure, no lower extremity edema, no palpitations, no shortness of breath, no vomiting and no weakness. She has tried nothing for the symptoms. There are no known risk factors.  Pertinent negatives for past medical history include no aneurysm.  Pertinent negatives for family medical history include: no aortic dissection.  Procedure history is negative for cardiac catheterization.    Sheryl Riley is a 66 y.o. female with h/o HTN and thyroid disease presenting to the Emergency Department concerning central chest pain onset at church last night. Dry cough associated. She describes constant pressure and burning moderately that is worse with deep breathing. Pt alleges she ate [employer] chicken and fries, popcorn adn orange juice yesterday. She states she has had similar pain with pneumonia and bronchitis in the past. No wheezing, congestion or SOB.  Past Medical History:  Diagnosis Date  . Hypertension   . Thyroid disease      Patient Active Problem List   Diagnosis Date Noted  . Impingement syndrome of left shoulder 03/22/2016  . Ingrown nail 11/19/2014  . Painful legs and moving toes 11/19/2014    Past Surgical History:  Procedure Laterality Date  . ABDOMINAL HYSTERECTOMY    . APPENDECTOMY    . BRAIN SURGERY    . EYE SURGERY      OB History    No data available       Home Medications    Prior to Admission medications   Medication Sig Start Date End Date Taking? Authorizing Provider  acetaminophen (TYLENOL) 325 MG tablet Take 650 mg by mouth every 6 (six) hours as needed for mild pain.    [provider]  amoxicillin-clavulanate (AUGMENTIN) 875-125 MG tablet Take 1 tablet by mouth 2 (two) times daily. 04/22/15   Elvina SidleLauenstein, Kurt, MD  atenolol (TENORMIN) 50 MG tablet Take 50 mg by mouth daily.    [provider]  Cyanocobalamin (VITAMIN B-12 PO) Take 1 tablet by mouth daily.    [provider]  diazepam (VALIUM) 5 MG tablet Take 1 tablet (5 mg total) by mouth every 8 (eight) hours as needed for muscle spasms. 04/26/15   Mesner, Barbara CowerJason, MD  levothyroxine (SYNTHROID, LEVOTHROID) 100 MCG tablet Take 100 mcg by mouth daily before breakfast.    [provider]  Multiple Vitamin (MULTIVITAMIN) tablet Take 1 tablet by mouth daily.    [provider]  niacin 500 MG tablet Take 500 mg by mouth at bedtime.    [provider]  omega-3 acid ethyl esters (LOVAZA) 1 G capsule Take  1 g by mouth 2 (two) times daily.     [provider]  triamterene-hydrochlorothiazide (MAXZIDE-25) 37.5-25 MG per tablet Take 1 tablet by mouth daily.     [provider]    Family History Family History  Problem Relation Age of Onset  . Alcohol abuse Brother   . Hypertension Brother   . Glaucoma Brother   . Hypertension Brother   . Glaucoma Brother   . Hypertension Brother     Social History Social History  Substance Use Topics  . Smoking status:  Never Smoker  . Smokeless tobacco: Never Used  . Alcohol use No     Allergies   Patient has no known allergies.   Review of Systems Review of Systems  Respiratory: Negative for chest tightness and shortness of breath.   Cardiovascular: Positive for chest pain. Negative for palpitations and leg swelling.  Gastrointestinal: Negative for abdominal pain and vomiting.  Musculoskeletal: Negative for back pain.  Neurological: Negative for weakness.  All other systems reviewed and are negative.    Physical Exam Updated Vital Signs BP 131/87 (BP Location: Left Arm)   Pulse 68   Temp 97.6 F (36.4 C) (Oral)   Resp 16   Ht 5\' 1"  (1.549 m)   Wt 178 lb (80.7 kg)   SpO2 100%   BMI 33.63 kg/m   Physical Exam  Constitutional: She is oriented to person, place, and time. She appears well-developed and well-nourished. No distress.  HENT:  Head: Normocephalic and atraumatic.  Mouth/Throat: Oropharynx is clear and moist and mucous membranes are normal. No oropharyngeal exudate.  Eyes: Pupils are equal, round, and reactive to light. Conjunctivae are normal.  Neck: Normal range of motion. Neck supple. No JVD present. Carotid bruit is not present.  No lymph adenopathy  Cardiovascular: Normal rate, regular rhythm, normal heart sounds and intact distal pulses.   Pulmonary/Chest: Effort normal and breath sounds normal. No stridor.  Abdominal: Soft. She exhibits no fluid wave and no mass. Bowel sounds are increased. There is no rebound and no guarding.  Bowel sounds into thoracic cavity with whooshes of fluid. Some constipation. NL bowel sounds.  Musculoskeletal: Normal range of motion. She exhibits no edema.  Intact distal pulses. No edema. All compartments soft  Lymphadenopathy:    She has no cervical adenopathy.  Neurological: She is alert and oriented to person, place, and time. She displays normal reflexes.  Skin: Skin is warm and dry. Capillary refill takes less than 2 seconds.   Psychiatric: She has a normal mood and affect.  Nursing note and vitals reviewed.    ED Treatments / Results   Vitals:   12/31/16 0245 12/31/16 0300  BP: 128/75 112/68  Pulse: (!) 54 (!) 57  Resp: 14 16  Temp:    SpO2: 100% 97%   DIAGNOSTIC STUDIES: Oxygen Saturation is 100% on RA, NL by my interpretation.    COORDINATION OF CARE: 2:51 AM-Discussed next steps with pt. Pt verbalized understanding and is agreeable with the plan. Will order labs, blood work and medications.   Labs (all labs ordered are listed, but only abnormal results are displayed)  Results for orders placed or performed during the hospital encounter of 12/31/16  Basic metabolic panel  Result Value Ref Range   Sodium 139 135 - 145 mmol/L   Potassium 3.5 3.5 - 5.1 mmol/L   Chloride 103 101 - 111 mmol/L   CO2 28 22 - 32 mmol/L   Glucose, Bld 108 (H) 65 - 99  mg/dL   BUN 18 6 - 20 mg/dL   Creatinine, Ser 1.61 0.44 - 1.00 mg/dL   Calcium 9.4 8.9 - 09.6 mg/dL   GFR calc non Af Amer 60 (L) >60 mL/min   GFR calc Af Amer >60 >60 mL/min   Anion gap 8 5 - 15  CBC  Result Value Ref Range   WBC 6.4 4.0 - 10.5 K/uL   RBC 4.36 3.87 - 5.11 MIL/uL   Hemoglobin 12.7 12.0 - 15.0 g/dL   HCT 04.5 40.9 - 81.1 %   MCV 86.5 78.0 - 100.0 fL   MCH 29.1 26.0 - 34.0 pg   MCHC 33.7 30.0 - 36.0 g/dL   RDW 91.4 78.2 - 95.6 %   Platelets 275 150 - 400 K/uL  I-stat troponin, ED  Result Value Ref Range   Troponin i, poc 0.00 0.00 - 0.08 ng/mL   Comment 3          I-stat troponin, ED  Result Value Ref Range   Troponin i, poc 0.01 0.00 - 0.08 ng/mL   Comment 3           Dg Chest 2 View  Result Date: 12/30/2016 CLINICAL DATA:  Lambert Mody, central chest pain, shortness of breath and cough for 1 hour. EXAM: CHEST  2 VIEW COMPARISON:  04/08/2016 and chest CT dated 04/20/2016. FINDINGS: Normal sized heart. Clear lungs. Mild diffuse accentuation of the interstitial markings. Normal vascularity. Mild thoracic spine degenerative changes.  IMPRESSION: No acute abnormality.  Mild chronic interstitial lung disease. Electronically Signed   By: Beckie Salts M.D.   On: 12/30/2016 23:24  ] EKG   EKG Interpretation  Date/Time:  Saturday December 31 2016 04:20:25 EDT Ventricular Rate:  60 PR Interval:    QRS Duration: 108 QT Interval:  472 QTC Calculation: 472 R Axis:   24 Text Interpretation:  Sinus rhythm Consider left atrial enlargement Confirmed by Nannette Zill ([id number]) on 12/31/2016 4:23:59 AM       Radiology Dg Chest 2 View  Result Date: 12/30/2016 CLINICAL DATA:  Lambert Mody, central chest pain, shortness of breath and cough for 1 hour. EXAM: CHEST  2 VIEW COMPARISON:  04/08/2016 and chest CT dated 04/20/2016. FINDINGS: Normal sized heart. Clear lungs. Mild diffuse accentuation of the interstitial markings. Normal vascularity. Mild thoracic spine degenerative changes. IMPRESSION: No acute abnormality.  Mild chronic interstitial lung disease. Electronically Signed   By: Beckie Salts M.D.   On: 12/30/2016 23:24    Procedures Procedures (including critical care time)  Medications Ordered in ED Medications  gi cocktail (Maalox,Lidocaine,Donnatal) (30 mLs Oral Given 12/31/16 0334)      Final Clinical Impressions(s) / ED Diagnoses  Symptoms consistent with GERD:  The patient is very well appearing and has been observed in the ED.  Strict return precautions given for: vomiting, weakness,  intractable abdominal pain, hard or rigid abdomen, inability to pass gas or stool, intractable diarrhea, inability to tolerate oral liquids or foods, shortness of breath, changes in vision or thinking, chest pain, dyspnea on exertion, weakness or numbness or any concerns. No signs of systemic illness or infection. The patient is nontoxic-appearing on exam and vital signs are within normal limits.   I have reviewed the triage vital signs and the nursing notes. Pertinent labs &imaging results that were available during my care of the patient were  reviewed by me and considered in my medical decision making (see chart for details).  After history, exam, and medical workup I feel the  patient has been appropriately medically screened and is safe for discharge home. Pertinent diagnoses were discussed with the patient. Patient was given return precautions.  I personally performed the services described in this documentation, which was scribed in my presence. The recorded information has been reviewed and is accurate.       Manette Doto, MD 12/31/16 [phone]

## 2017-07-12 ENCOUNTER — Other Ambulatory Visit: Payer: Self-pay | Admitting: Orthopaedic Surgery

## 2017-07-12 DIAGNOSIS — M5136 Other intervertebral disc degeneration, lumbar region: Secondary | ICD-10-CM

## 2017-12-10 ENCOUNTER — Ambulatory Visit
Admission: RE | Admit: 2017-12-10 | Discharge: 2017-12-10 | Disposition: A | Discharge status: Home / Self Care | Payer: Self-pay | Source: Ambulatory Visit | Attending: Orthopaedic Surgery | Admitting: Orthopaedic Surgery

## 2017-12-10 DIAGNOSIS — M5136 Other intervertebral disc degeneration, lumbar region: Secondary | ICD-10-CM

## 2018-09-27 IMAGING — CR DG CHEST 2V
2 series · 2 of 2 positions shown · non-contrast
Comparison: 04/08/2016 and chest CT dated 04/20/2016.

CLINICAL DATA: Sharp, central chest pain, shortness of breath and
cough for 1 hour.

EXAM:
CHEST  2 VIEW

[chest pa]
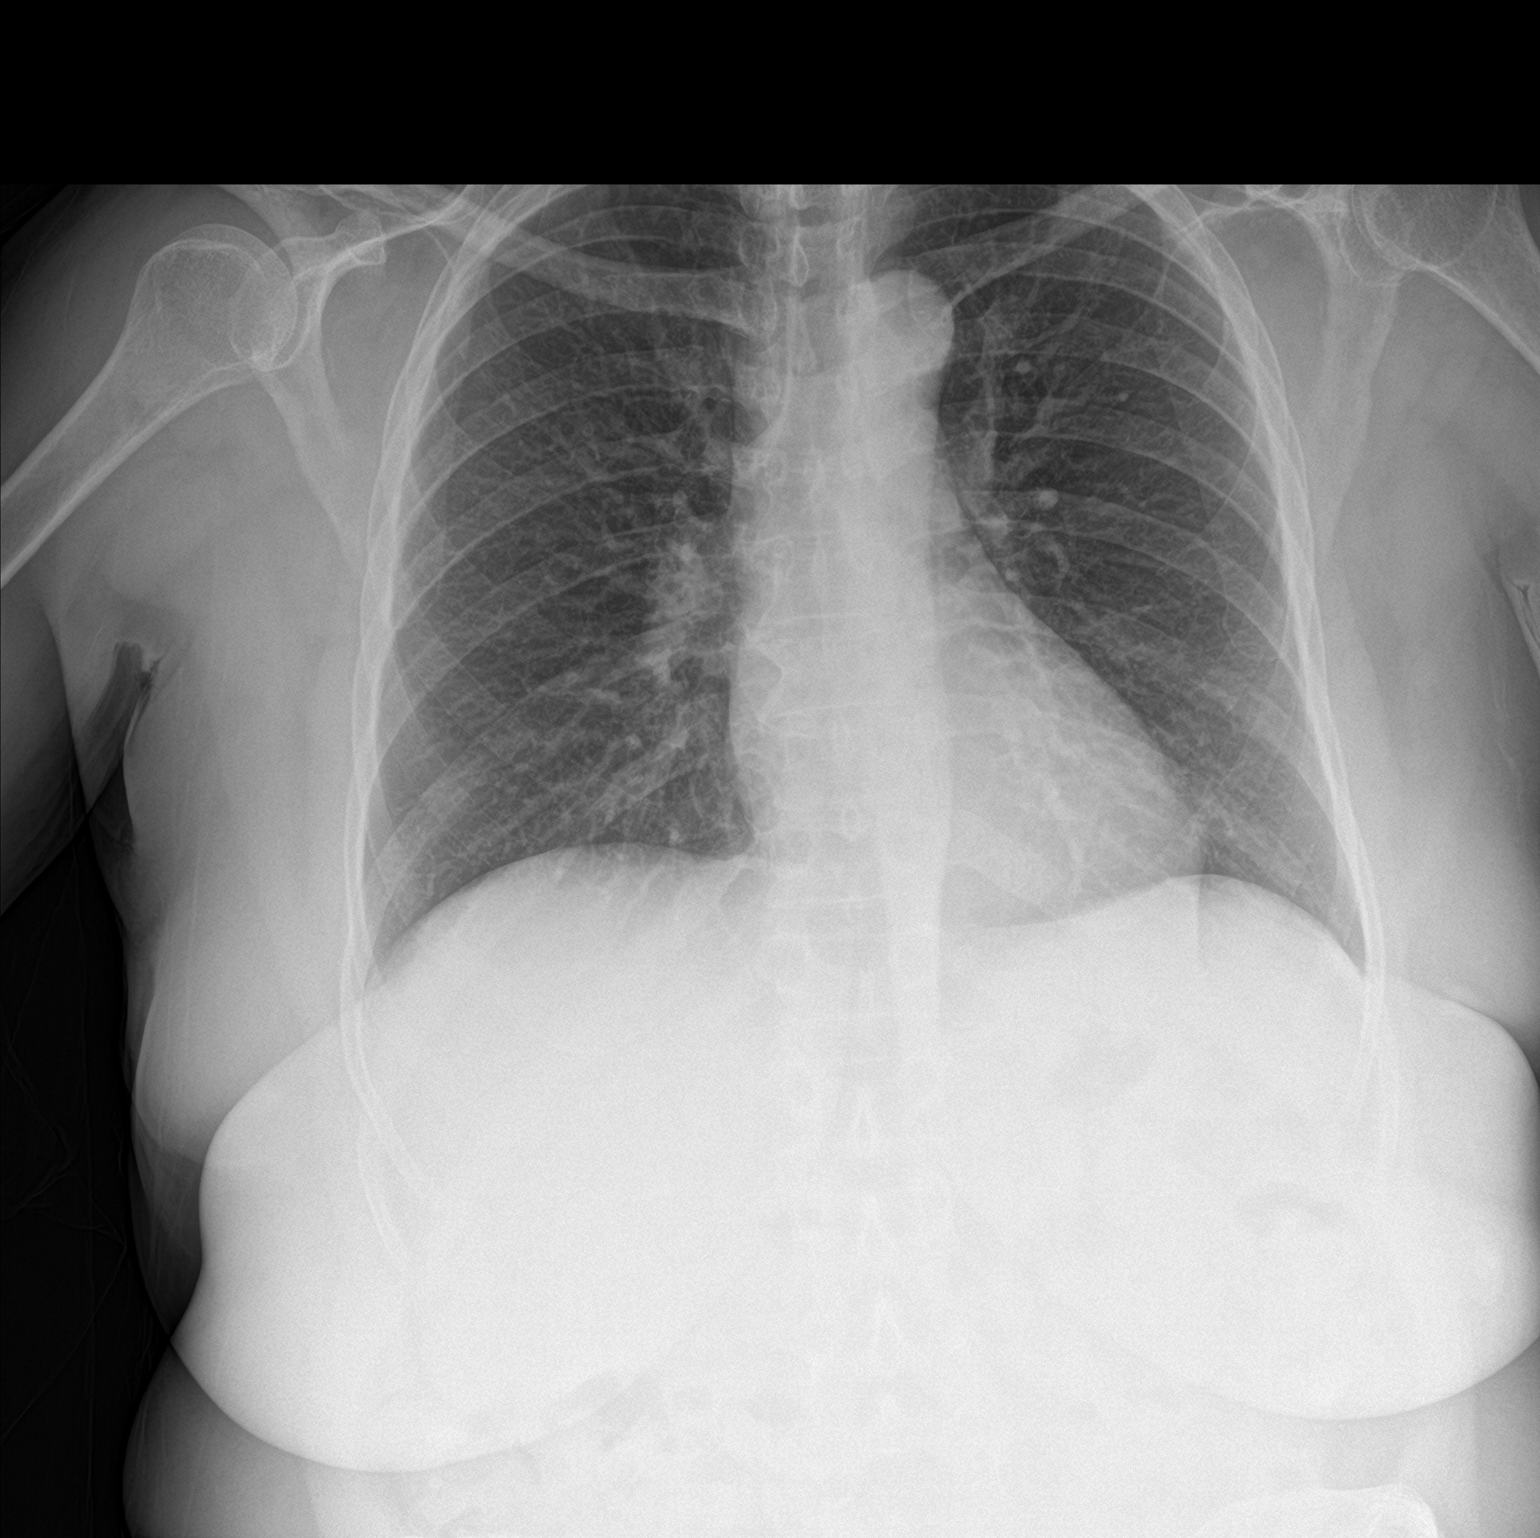

[chest lat]
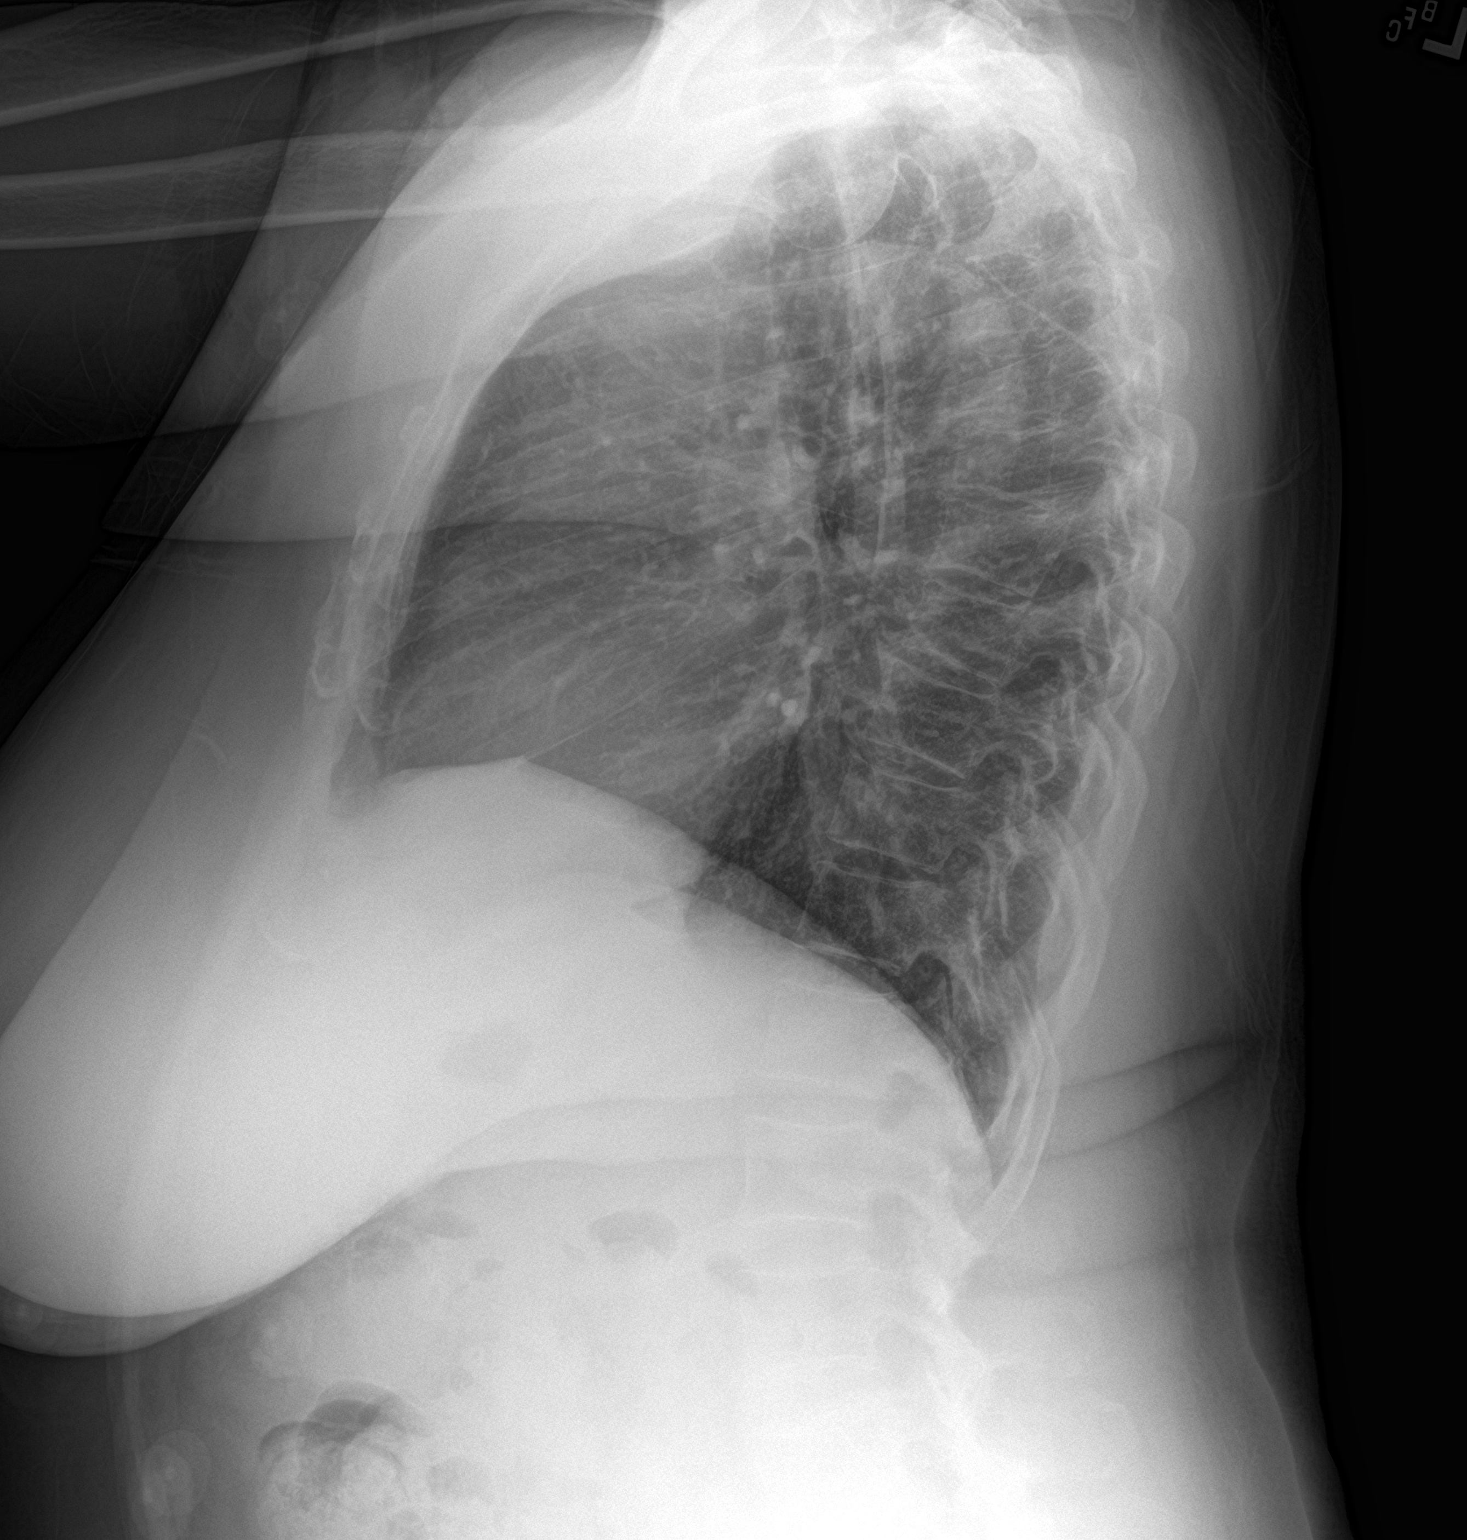

[2 of 2 positions shown; findings below may reference images not displayed]

FINDINGS: Normal sized heart. Clear lungs. Mild diffuse accentuation of the
interstitial markings. Normal vascularity. Mild thoracic spine
degenerative changes.
IMPRESSION: No acute abnormality.  Mild chronic interstitial lung disease.

## 2019-05-22 ENCOUNTER — Ambulatory Visit: Payer: Self-pay | Attending: Internal Medicine

## 2019-05-22 DIAGNOSIS — Z20822 Contact with and (suspected) exposure to covid-19: Secondary | ICD-10-CM

## 2019-05-23 LAB — NOVEL CORONAVIRUS, NAA: SARS-CoV-2, NAA: NOT DETECTED

## 2019-05-28 ENCOUNTER — Ambulatory Visit: Payer: Medicare Other | Admitting: Sports Medicine

## 2019-09-10 ENCOUNTER — Other Ambulatory Visit: Payer: Self-pay | Admitting: Orthopaedic Surgery

## 2019-09-10 DIAGNOSIS — M545 Low back pain, unspecified: Secondary | ICD-10-CM

## 2019-09-28 ENCOUNTER — Other Ambulatory Visit: Payer: Self-pay

## 2019-10-23 ENCOUNTER — Ambulatory Visit
Admission: RE | Admit: 2019-10-23 | Discharge: 2019-10-23 | Disposition: A | Discharge status: Home / Self Care | Payer: Self-pay | Source: Ambulatory Visit | Attending: Orthopaedic Surgery | Admitting: Orthopaedic Surgery

## 2019-10-23 ENCOUNTER — Other Ambulatory Visit: Payer: Self-pay

## 2019-10-23 DIAGNOSIS — M545 Low back pain, unspecified: Secondary | ICD-10-CM

## 2019-11-01 ENCOUNTER — Ambulatory Visit
Admission: RE | Admit: 2019-11-01 | Discharge: 2019-11-01 | Disposition: A | Discharge status: Home / Self Care | Payer: Medicare Other | Source: Ambulatory Visit | Attending: Internal Medicine | Admitting: Internal Medicine

## 2019-11-01 ENCOUNTER — Other Ambulatory Visit: Payer: Self-pay | Admitting: Internal Medicine

## 2019-11-01 DIAGNOSIS — R109 Unspecified abdominal pain: Secondary | ICD-10-CM

## 2019-11-14 ENCOUNTER — Other Ambulatory Visit: Payer: Self-pay

## 2019-11-14 ENCOUNTER — Encounter (HOSPITAL_COMMUNITY): Payer: Self-pay

## 2019-11-14 ENCOUNTER — Ambulatory Visit (HOSPITAL_COMMUNITY)
Admission: EM | Admit: 2019-11-14 | Discharge: 2019-11-14 | Disposition: A | Discharge status: Home / Self Care | Payer: Medicare Other | Attending: Family Medicine | Admitting: Family Medicine

## 2019-11-14 DIAGNOSIS — S61214A Laceration without foreign body of right ring finger without damage to nail, initial encounter: Secondary | ICD-10-CM | POA: Diagnosis not present

## 2019-11-14 NOTE — Discharge Instructions (Signed)
We placed glue on the cut Keep dry as best as you can for 24 hours.  You can then get wet but do not submerge in water.  The glue will fall off when the wound has healed.  Follow up as needed for continued or worsening symptoms

## 2019-11-14 NOTE — ED Triage Notes (Signed)
Pt report she cut the right ringer finger with a food processor 2 hrs ago approx. Pt do not remember if she had the Tdap up top date.

## 2019-11-15 NOTE — ED Provider Notes (Signed)
[facility]    CSN: [id number] Arrival date & time: 11/14/19  1002      History   Chief Complaint Chief Complaint  Patient presents with  . Laceration    HPI Sheryl Riley is a 69 y.o. female.   Patient is a 69 year old female presents today with laceration to tip of the right ring finger.  This occurred approximate 2 hours ago when she was using a food processor.  Reporting tetanus up-to-date.  Bleeding controlled.  Sensation intact.  ROS per HPI      Past Medical History:  Diagnosis Date  . Hypertension   . Thyroid disease     Patient Active Problem List   Diagnosis Date Noted  . Impingement syndrome of left shoulder 03/22/2016  . Ingrown nail 11/19/2014  . Painful legs and moving toes 11/19/2014    Past Surgical History:  Procedure Laterality Date  . ABDOMINAL HYSTERECTOMY    . APPENDECTOMY    . BRAIN SURGERY    . EYE SURGERY      OB History   No obstetric history on file.      Home Medications    Prior to Admission medications   Medication Sig Start Date End Date Taking? Authorizing Provider  atenolol (TENORMIN) 50 MG tablet Take 50 mg by mouth daily.    [provider]  levothyroxine (SYNTHROID, LEVOTHROID) 100 MCG tablet Take 100 mcg by mouth daily before breakfast.    [provider]  sucralfate (CARAFATE) 1 GM/10ML suspension Take 10 mLs (1 g total) by mouth 4 (four) times daily -  with meals and at bedtime. 12/31/16   Palumbo, April, MD  triamterene-hydrochlorothiazide (MAXZIDE-25) 37.5-25 MG per tablet Take 1 tablet by mouth daily.     [provider]    Family History Family History  Problem Relation Age of Onset  . Alcohol abuse Brother   . Hypertension Brother   . Glaucoma Brother   . Hypertension Brother   . Glaucoma Brother   . Hypertension Brother     Social History Social History   Tobacco Use  . Smoking status: Never Smoker  . Smokeless tobacco: Never Used  Substance Use Topics    . Alcohol use: No    Alcohol/week: 0.0 standard drinks  . Drug use: No     Allergies   Patient has no known allergies.   Review of Systems Review of Systems   Physical Exam Triage Vital Signs ED Triage Vitals  Enc Vitals Group     BP 11/14/19 1048 106/72     Pulse Rate 11/14/19 1048 62     Resp 11/14/19 1048 18     Temp 11/14/19 1048 97.9 F (36.6 C)     Temp Source 11/14/19 1048 Oral     SpO2 11/14/19 1048 100 %     Weight --      Height --      Head Circumference --      Peak Flow --      Pain Score 11/14/19 1047 2     Pain Loc --      Pain Edu? --      Excl. in [place]? --    No data found.  Updated Vital Signs BP 106/72 (BP Location: Left Arm)   Pulse 62   Temp 97.9 F (36.6 C) (Oral)   Resp 18   SpO2 100%   Visual Acuity Right Eye Distance:   Left Eye Distance:   Bilateral Distance:  Right Eye Near:   Left Eye Near:    Bilateral Near:     Physical Exam Vitals and nursing note reviewed.  Constitutional:      General: She is not in acute distress.    Appearance: Normal appearance. She is not ill-appearing, toxic-appearing or diaphoretic.  HENT:     Head: Normocephalic.     Nose: Nose normal.  Eyes:     Conjunctiva/sclera: Conjunctivae normal.  Pulmonary:     Effort: Pulmonary effort is normal.  Musculoskeletal:        General: Normal range of motion.     Cervical back: Normal range of motion.  Skin:    General: Skin is warm and dry.     Findings: No rash.     Comments: Approximated 1 cm superficial laceration to tip of right ring finger  Neurological:     Mental Status: She is alert.  Psychiatric:        Mood and Affect: Mood normal.      UC Treatments / Results  Labs (all labs ordered are listed, but only abnormal results are displayed) Labs Reviewed - No data to display  EKG   Radiology No results found.  Procedures Laceration Repair  Date/Time: 11/15/2019 8:33 AM Performed by: Janace Aris, NP Authorized by: Janace Aris, NP   Consent:    Consent obtained:  Verbal   Consent given by:  Patient   Risks discussed:  Infection and pain   Alternatives discussed:  No treatment and delayed treatment Universal protocol:    Patient identity confirmed:  Verbally with patient Anesthesia (see MAR for exact dosages):    Anesthesia method:  None Laceration details:    Location:  Finger   Finger location:  R ring finger   Length (cm):  1 Repair type:    Repair type:  Simple Exploration:    Hemostasis achieved with:  Direct pressure   Contaminated: no   Treatment:    Area cleansed with:  Soap and water   Amount of cleaning:  Standard Skin repair:    Repair method:  Tissue adhesive Approximation:    Approximation:  Close Post-procedure details:    Dressing:  Open (no dressing)   (including critical care time)  Medications Ordered in UC Medications - No data to display  Initial Impression / Assessment and Plan / UC Course  I have reviewed the triage vital signs and the nursing notes.  Pertinent labs & imaging results that were available during my care of the patient were reviewed by me and considered in my medical decision making (see chart for details).     Laceration to tip of right ring finger.  Very superficial.  Clean wound and apply Dermabond. Instructions given on care Follow up as needed for continued or worsening symptoms  Final Clinical Impressions(s) / UC Diagnoses   Final diagnoses:  Laceration of right ring finger without foreign body without damage to nail, initial encounter     Discharge Instructions     We placed glue on the cut Keep dry as best as you can for 24 hours.  You can then get wet but do not submerge in water.  The glue will fall off when the wound has healed.  Follow up as needed for continued or worsening symptoms     ED Prescriptions    None     PDMP not reviewed this encounter.   Janace Aris, NP 11/15/19 [phone]

## 2019-12-13 ENCOUNTER — Encounter: Payer: Self-pay | Admitting: Internal Medicine

## 2019-12-16 ENCOUNTER — Ambulatory Visit: Payer: Medicare Other | Attending: Internal Medicine

## 2019-12-16 DIAGNOSIS — Z20822 Contact with and (suspected) exposure to covid-19: Secondary | ICD-10-CM

## 2019-12-17 LAB — SARS-COV-2, NAA 2 DAY TAT

## 2019-12-17 LAB — NOVEL CORONAVIRUS, NAA: SARS-CoV-2, NAA: NOT DETECTED

## 2019-12-23 ENCOUNTER — Other Ambulatory Visit: Payer: Self-pay

## 2019-12-23 ENCOUNTER — Ambulatory Visit: Payer: Medicare Other | Admitting: Podiatry

## 2019-12-23 ENCOUNTER — Encounter: Payer: Self-pay | Admitting: Podiatry

## 2019-12-23 DIAGNOSIS — B351 Tinea unguium: Secondary | ICD-10-CM

## 2019-12-23 DIAGNOSIS — M79676 Pain in unspecified toe(s): Secondary | ICD-10-CM

## 2019-12-23 NOTE — Progress Notes (Signed)
   SUBJECTIVE Patient presents to office today complaining of elongated, thickened nails that cause pain while ambulating in shoes.  She is unable to trim her own nails.  She states that recently she has been increasing her walking.  She joined a walking club and walks 5 miles per day.  She has noticed increased pain and tenderness to the bilateral great toes and second toe ever since she began to increase her walking patient is here for further evaluation and treatment.  Past Medical History:  Diagnosis Date  . Hypertension   . Thyroid disease     OBJECTIVE General Patient is awake, alert, and oriented x 3 and in no acute distress. Derm Skin is dry and supple bilateral. Negative open lesions or macerations. Remaining integument unremarkable. Nails are tender, long, thickened and dystrophic with subungual debris, consistent with onychomycosis, 1-5 bilateral. No signs of infection noted. Vasc  DP and PT pedal pulses palpable bilaterally. Temperature gradient within normal limits.  Neuro Epicritic and protective threshold sensation grossly intact bilaterally.  Musculoskeletal Exam No symptomatic pedal deformities noted bilateral. Muscular strength within normal limits.  ASSESSMENT 1. Onychodystrophic nails 1-5 bilateral with hyperkeratosis of nails.  2. Onychomycosis of nail due to dermatophyte bilateral 3. Pain in foot bilateral  PLAN OF CARE 1. Patient evaluated today.  2. Instructed to maintain good pedal hygiene and foot care.  3. Mechanical debridement of nails 1-5 bilaterally performed using a nail nipper. Filed with dremel without incident.  4. Return to clinic in 3 mos. for follow-up routine care   Felecia Shelling, DPM [facility]  Dr. Felecia Shelling, DPM    [address]                                        [place], [place] [address]                Office [phone]  Fax [phone]

## 2020-01-08 ENCOUNTER — Other Ambulatory Visit: Payer: Self-pay | Admitting: Radiology

## 2020-02-12 ENCOUNTER — Ambulatory Visit: Payer: Medicare Other | Admitting: Internal Medicine

## 2020-03-27 ENCOUNTER — Ambulatory Visit: Payer: Medicare Other | Admitting: Podiatry

## 2020-09-23 ENCOUNTER — Ambulatory Visit: Payer: Medicare Other | Admitting: Podiatry

## 2020-10-05 ENCOUNTER — Ambulatory Visit: Payer: Medicare Other | Admitting: Podiatry

## 2020-11-17 ENCOUNTER — Encounter: Payer: Self-pay | Admitting: Neurology

## 2021-02-08 ENCOUNTER — Ambulatory Visit: Payer: Medicare Other | Admitting: Neurology

## 2021-07-20 IMAGING — MR MR LUMBAR SPINE W/O CM
4 of 5 series · 25 of 48 positions shown · non-contrast
Comparison: 12/10/2017 MRI lumbar spine.

CLINICAL DATA: Right lower back pain.

EXAM:
MRI LUMBAR SPINE WITHOUT CONTRAST
TECHNIQUE: Multiplanar, multisequence MR imaging of the lumbar spine was
performed. No intravenous contrast was administered.

[Series 3: T1 · sagittal · 4.0mm · 0.55mm/px · 5 of 13 slices shown (1 of 2)]
[im 1/13]
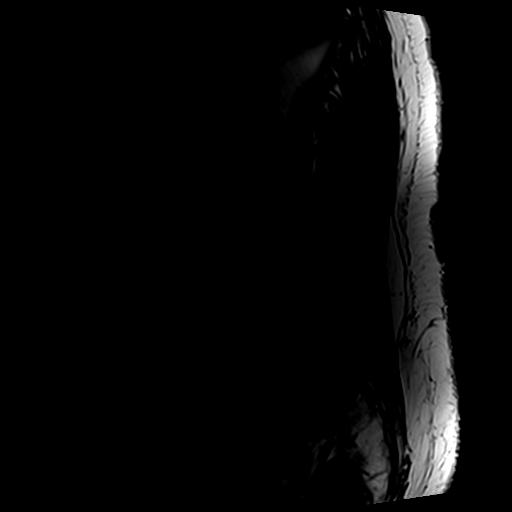
[im 4/13]
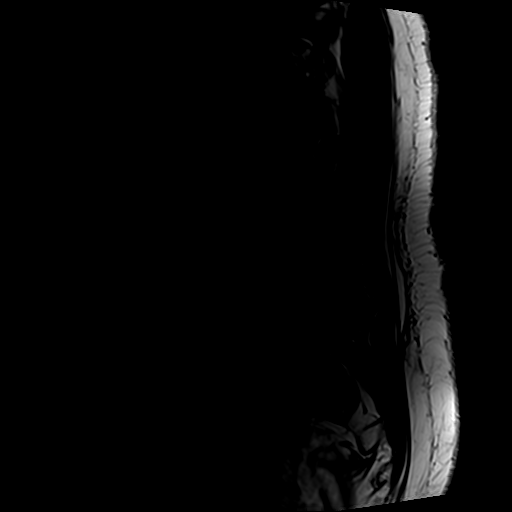
[im 7/13]
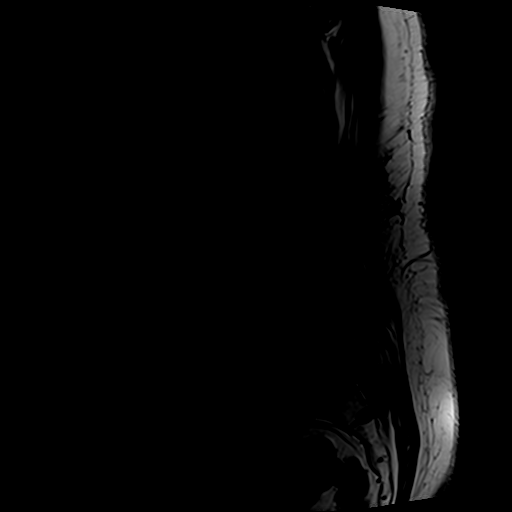
[im 10/13]
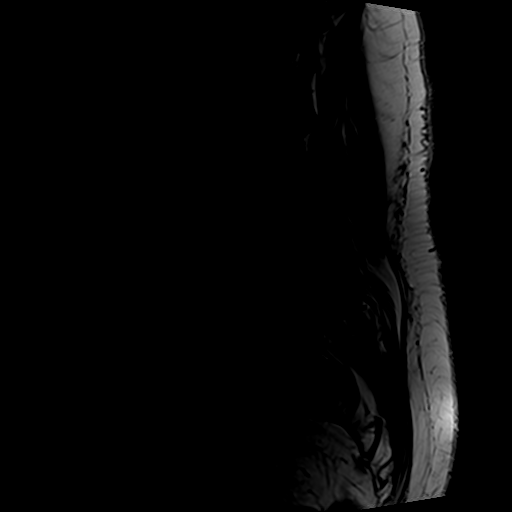
[im 13/13]
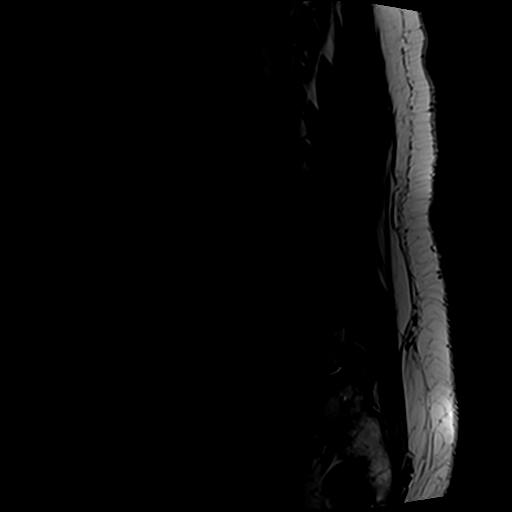

[Series 4: T2 · sagittal · 4.0mm · 0.55mm/px · 6 of 13 slices shown (1 of 2)]
[im 1/13]
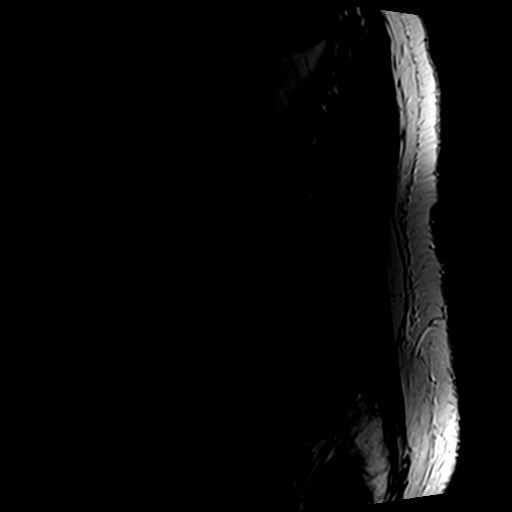
[im 3/13]
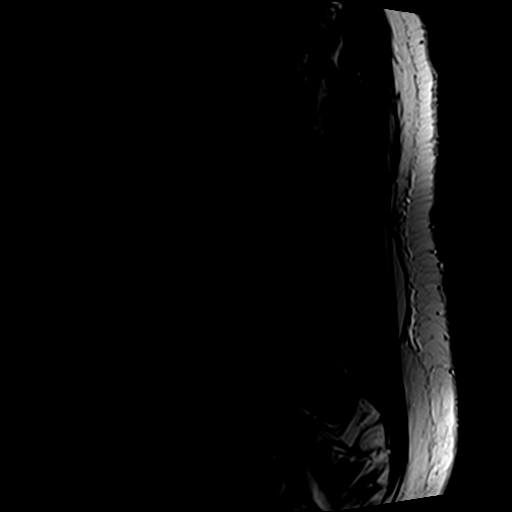
[im 5/13]
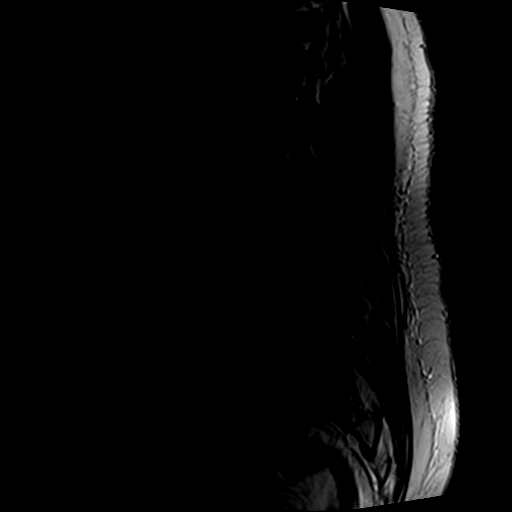
[im 8/13]
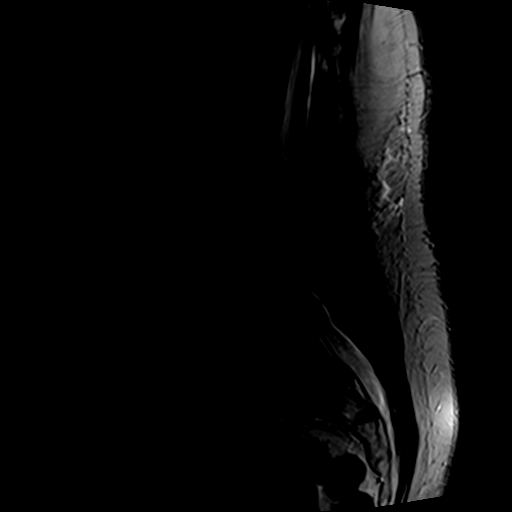
[im 10/13]
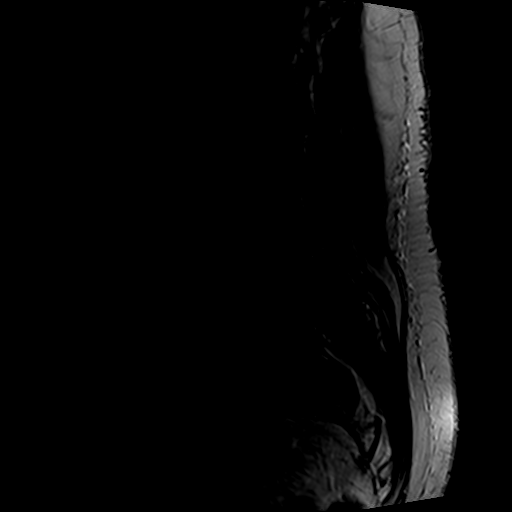
[im 13/13]
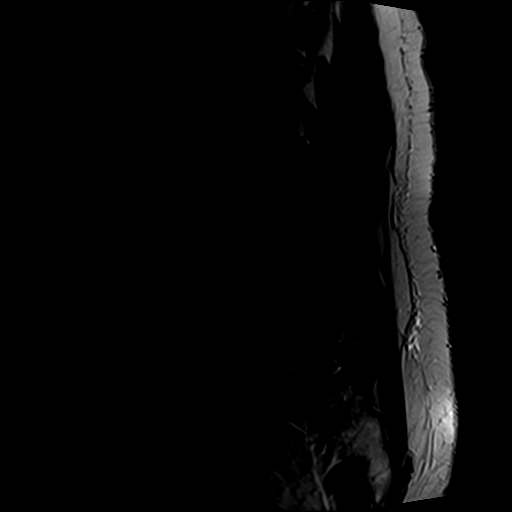

[Series 7: T1 · axial · 4.0mm · 0.35mm/px · z∈[-100,+64]mm · 4 of 36 slices shown (2 of 2)]
[im 3/36]
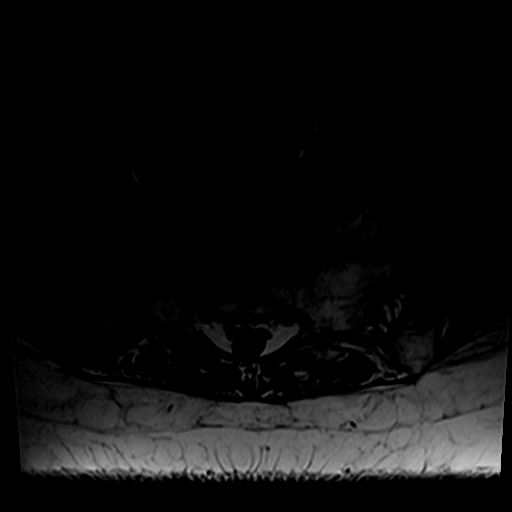
[im 5/36]
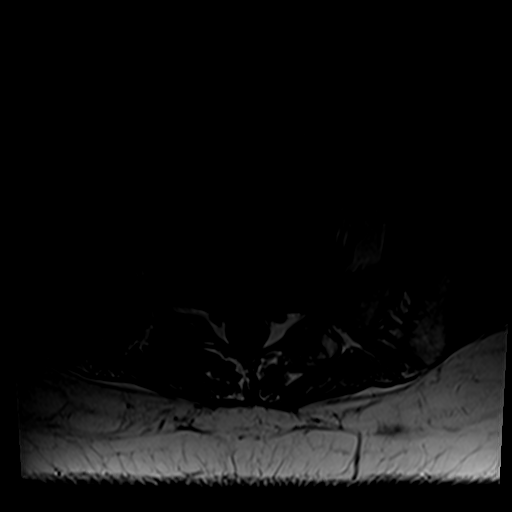
[im 19/36]
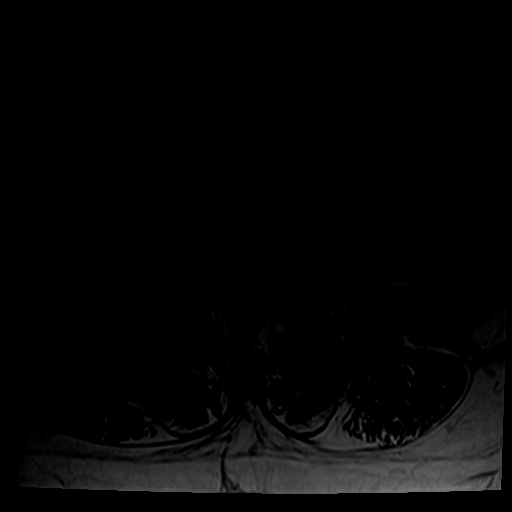
[im 31/36]
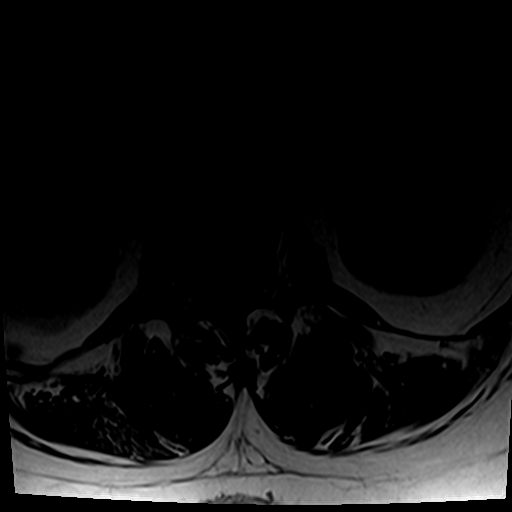

[Series 8: T2 · axial · 4.0mm · 0.70mm/px · z∈[-100,+90]mm · 10 of 36 slices shown (2 of 2)]
[im 3/36]
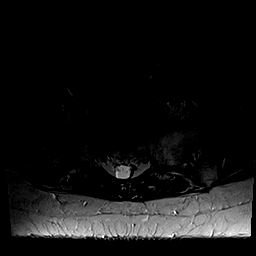
[im 5/36]
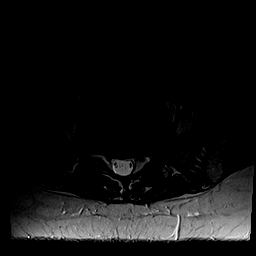
[im 8/36]
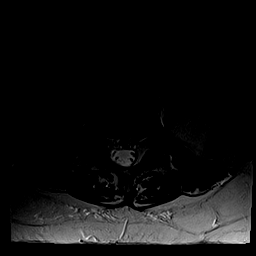
[im 12/36]
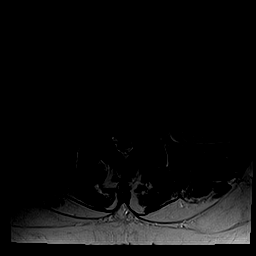
[im 17/36]
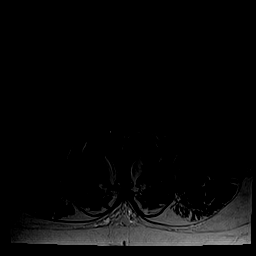
[im 19/36]
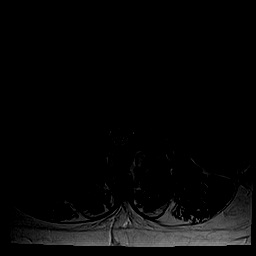
[im 22/36]
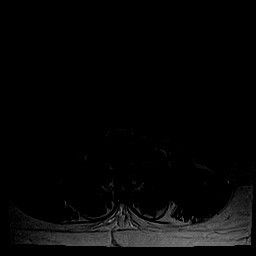
[im 26/36]
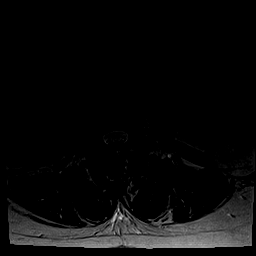
[im 31/36]
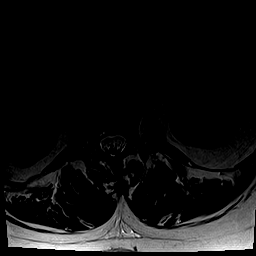
[im 36/36]
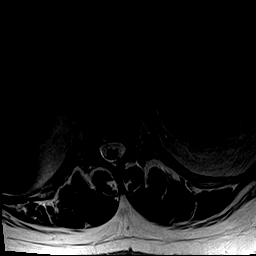

[25 of 48 positions shown; findings below may reference images not displayed]

FINDINGS: Segmentation:  Standard.

Alignment: Minimal grade 1 L4-5 and L5-S1 anterolisthesis is
unchanged.

Vertebrae: Normal bone marrow signal intensity. No focal osseous
lesions.

Conus medullaris and cauda equina: Conus extends to the L1 level.
Conus and cauda equina appear normal.

Disc levels: Multilevel desiccation with mild disc space loss most
prominent at the L5-S1 level.

L1-2: Small central protrusion with minimal cephalad migration,
unchanged. No significant spinal canal or neural foraminal
narrowing.

L2-3: Disc bulge, mild ligamentum flavum and bilateral facet
hypertrophy. Mild right neural foraminal narrowing.

L3-4: Disc bulge, ligamentum flavum and bilateral facet hypertrophy.
Mild spinal canal, moderate right and mild left neural foraminal
narrowing.

L4-5: Disc bulge with superimposed right paracentral protrusion,
prominent ligamentum flavum bilateral facet hypertrophy. Abutment of
the descending L5 and exiting L4 nerve roots is unchanged. Mild
spinal canal, severe right and mild left neural foraminal narrowing.

L5-S1: Disc bulge abutting the left greater than right descending
bilateral S1 nerve roots and bilateral facet hypertrophy. Mild right
neural foraminal narrowing.

Paraspinal and other soft tissues: Within normal limits.
IMPRESSION: Multilevel spondylosis, grossly unchanged. Exiting bilateral
L4/descending L5 nerve root abutment at the L4-5 level is unchanged.

Moderate L3-4 and severe L4-5 right neural foraminal narrowing,
unchanged.

Mild L3-5 spinal canal narrowing, similar to prior exam.

## 2021-07-29 IMAGING — CT CT ABD-PELV W/O CM
1 of 2 series · 14 of 32 positions shown, 19 images · non-contrast
Comparison: 04/26/2015

CLINICAL DATA: Left-sided abdominal pain with microhematuria for 3
days

EXAM:
CT ABDOMEN AND PELVIS WITHOUT CONTRAST
TECHNIQUE: Multidetector CT imaging of the abdomen and pelvis was performed
following the standard protocol without IV contrast.

[Series 2: abd/pelvis w/(date) · axial · 0.83mm/px · z∈[-471,-96]mm · 14 of 86 slices shown, 19 images]
[im 6/86  soft-tissue]
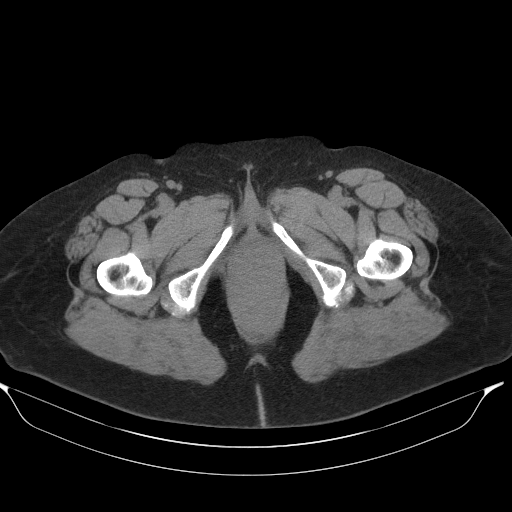
[im 6/86  bone]
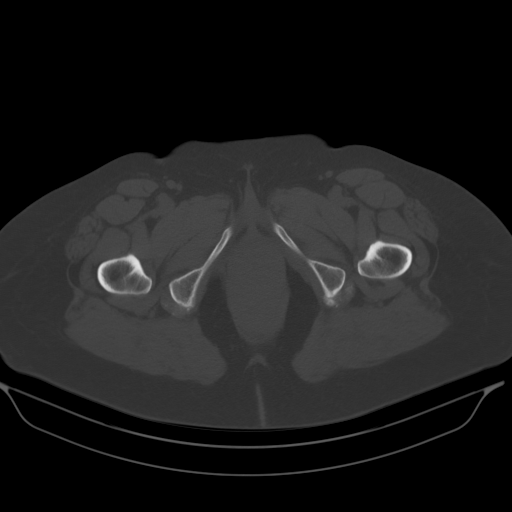
[im 11/86  soft-tissue]
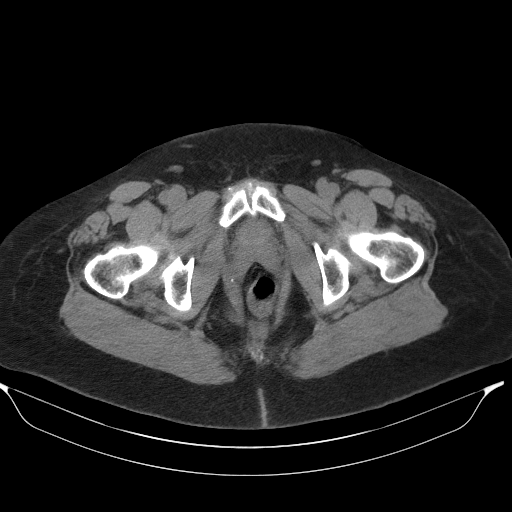
[im 21/86  soft-tissue]
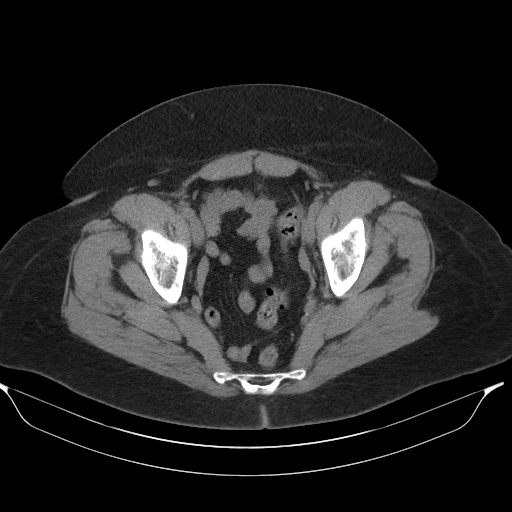
[im 26/86  soft-tissue]
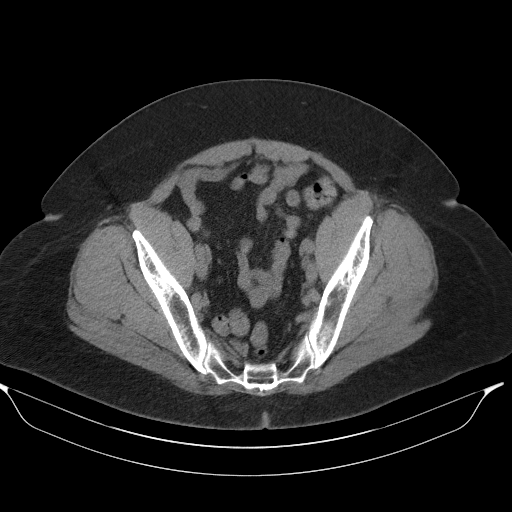
[im 31/86  soft-tissue]
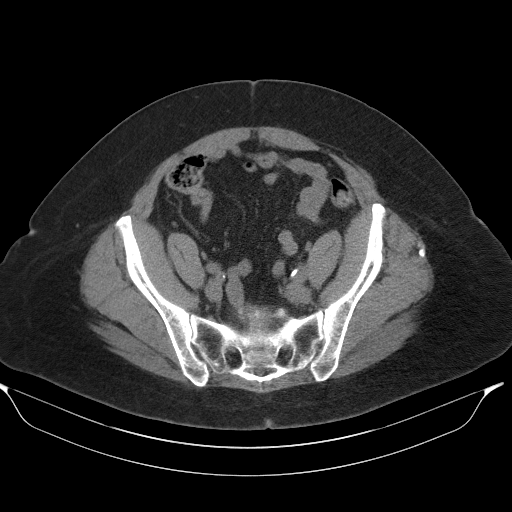
[im 36/86  soft-tissue]
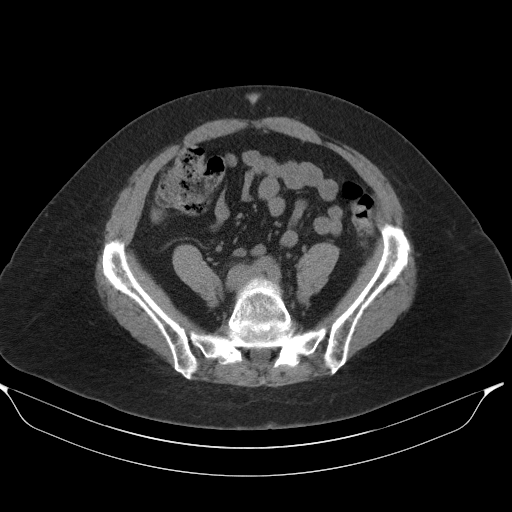
[im 46/86  soft-tissue]
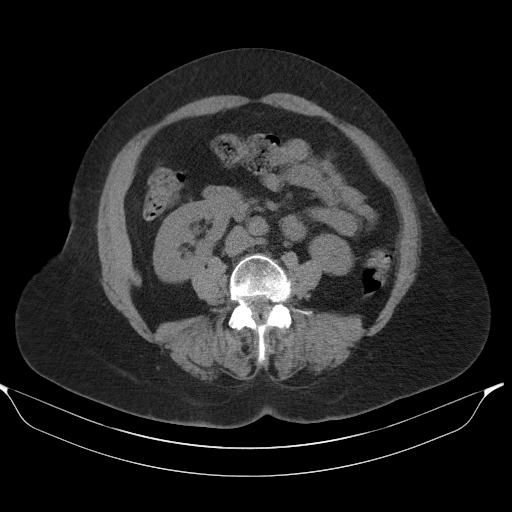
[im 51/86  soft-tissue]
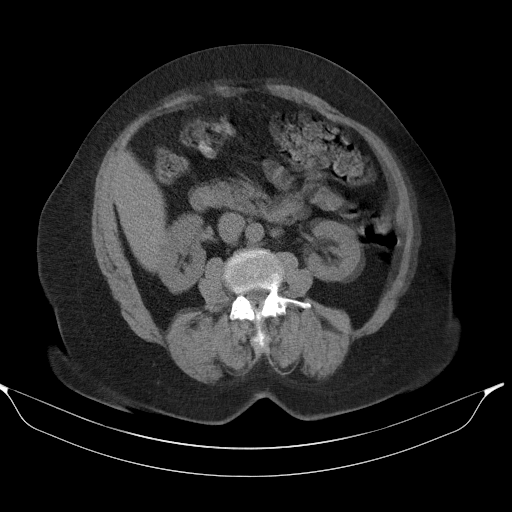
[im 56/86  soft-tissue]
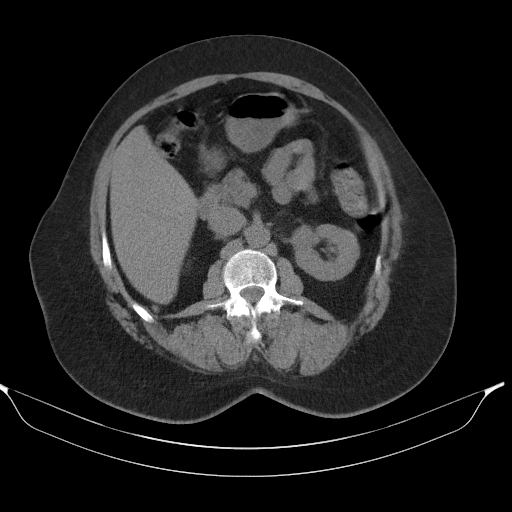
[im 56/86  bone]
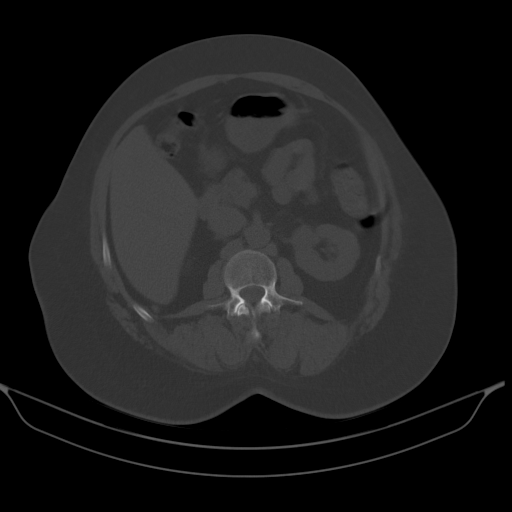
[im 61/86  soft-tissue]
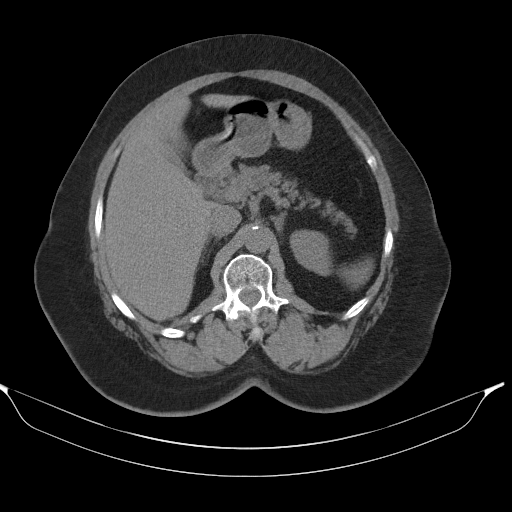
[im 66/86  soft-tissue]
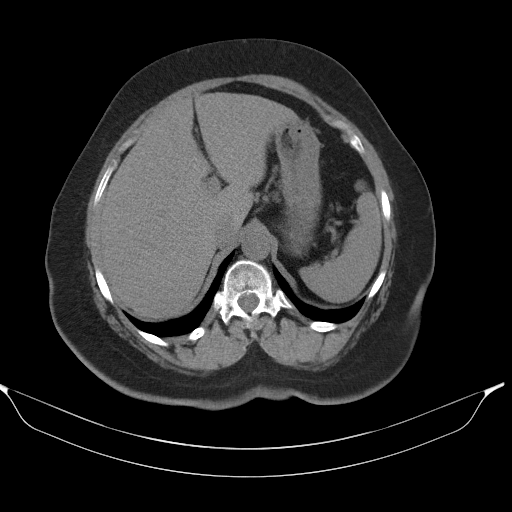
[im 66/86  lung]
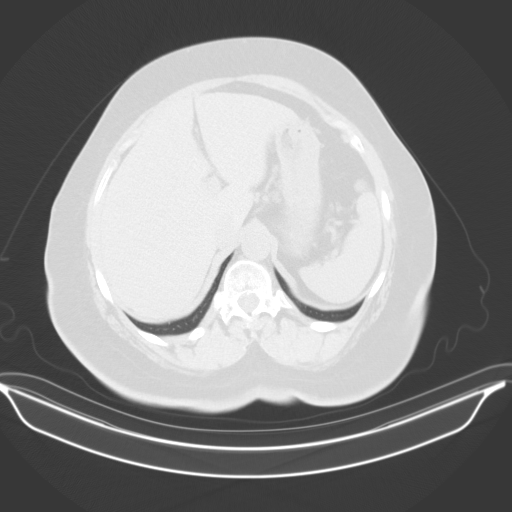
[im 71/86  lung]
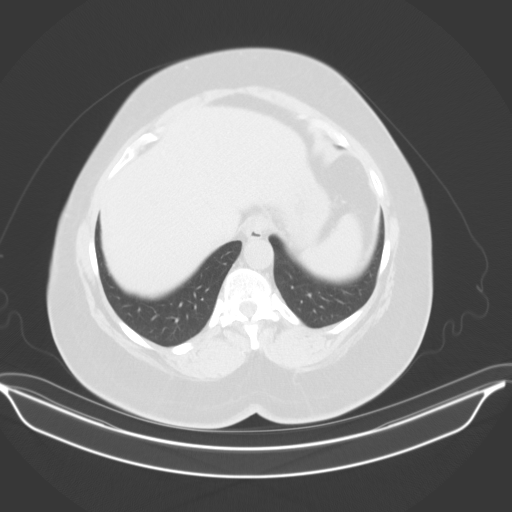
[im 76/86  soft-tissue]
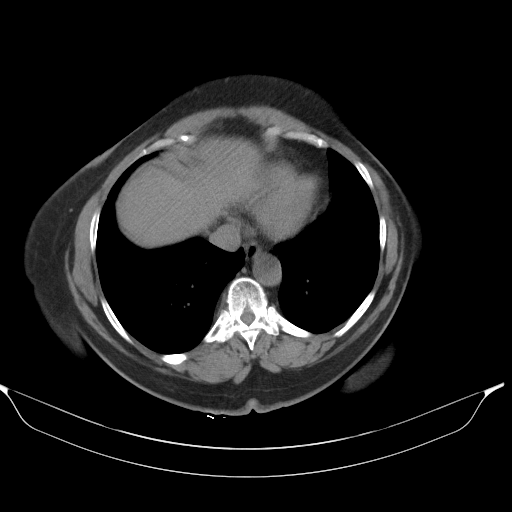
[im 76/86  lung]
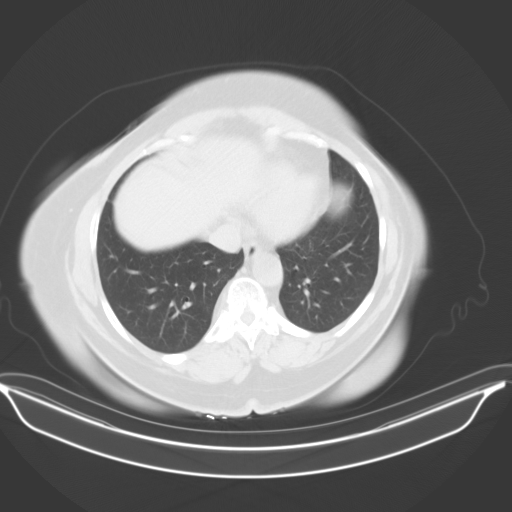
[im 81/86  soft-tissue]
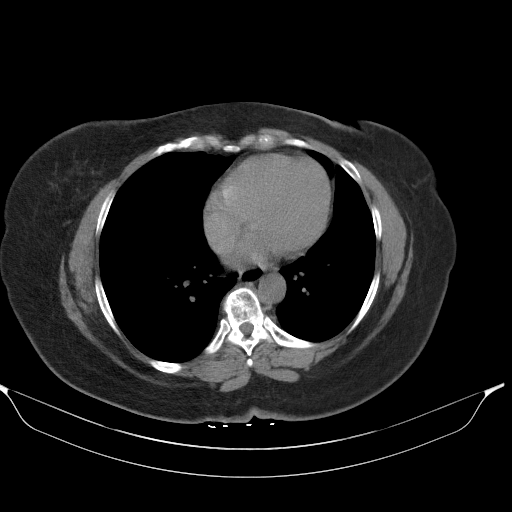
[im 81/86  lung]
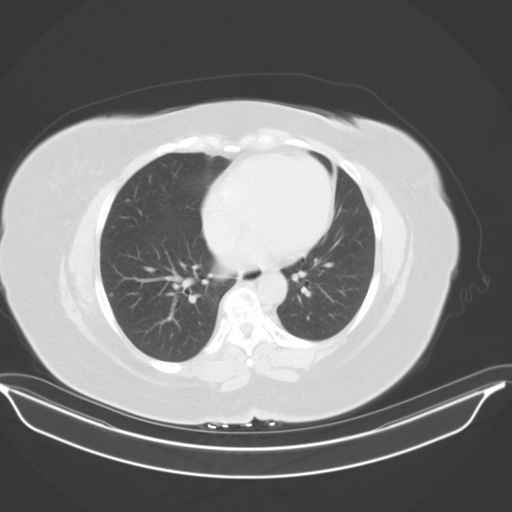

[14 of 32 positions shown; findings below may reference images not displayed]

FINDINGS: Lower chest: No acute abnormality.

Hepatobiliary: No focal liver abnormality is seen. No gallstones,
gallbladder wall thickening, or biliary dilatation.

Pancreas: Unremarkable. No pancreatic ductal dilatation or
surrounding inflammatory changes.

Spleen: Normal in size without focal abnormality.

Adrenals/Urinary Tract: Adrenal glands are within normal limits.
Kidneys are well visualized bilaterally. No definitive renal calculi
or obstructive changes are seen. Bladder is decompressed.

Stomach/Bowel: Mild diverticular change of the colon is noted
without evidence of diverticulitis. Appendix is not visualized
consistent with prior surgical history. The stomach and small bowel
are unremarkable.

Vascular/Lymphatic: Aortic atherosclerosis. No enlarged abdominal or
pelvic lymph nodes.

Reproductive: Status post hysterectomy. No adnexal masses.

Other: No abdominal wall hernia or abnormality. No abdominopelvic
ascites.

Musculoskeletal: No acute or significant osseous findings.
IMPRESSION: No renal calculi or urinary tract obstructive changes are seen.

No acute abnormality to correspond with the patient's given clinical
symptomatology.
